# Patient Record
Sex: Male | Born: 1970 | Race: Black or African American | Hispanic: No | Marital: Single | Smoking: Current every day smoker
Health system: Southern US, Community
[De-identification: ages and names within clinical notes are randomized; demographics above are authoritative.]

## PROBLEM LIST (undated history)

## (undated) HISTORY — PX: TOTAL HIP ARTHROPLASTY: SHX124

---

## 2013-05-16 ENCOUNTER — Emergency Department (HOSPITAL_BASED_OUTPATIENT_CLINIC_OR_DEPARTMENT_OTHER): Payer: Self-pay

## 2013-05-16 ENCOUNTER — Encounter (HOSPITAL_BASED_OUTPATIENT_CLINIC_OR_DEPARTMENT_OTHER): Payer: Self-pay | Admitting: Emergency Medicine

## 2013-05-16 ENCOUNTER — Emergency Department (HOSPITAL_BASED_OUTPATIENT_CLINIC_OR_DEPARTMENT_OTHER)
Admission: EM | Admit: 2013-05-16 | Discharge: 2013-05-17 | Disposition: A | Discharge status: Home / Self Care | Payer: Self-pay | Attending: Emergency Medicine | Admitting: Emergency Medicine

## 2013-05-16 DIAGNOSIS — F17200 Nicotine dependence, unspecified, uncomplicated: Secondary | ICD-10-CM | POA: Insufficient documentation

## 2013-05-16 DIAGNOSIS — M7062 Trochanteric bursitis, left hip: Secondary | ICD-10-CM

## 2013-05-16 DIAGNOSIS — M76899 Other specified enthesopathies of unspecified lower limb, excluding foot: Secondary | ICD-10-CM | POA: Insufficient documentation

## 2013-05-16 DIAGNOSIS — Z96649 Presence of unspecified artificial hip joint: Secondary | ICD-10-CM | POA: Insufficient documentation

## 2013-05-16 NOTE — ED Notes (Signed)
Pt has had left hip replacement- c/o pain x 2 weeks without known injury- ambulates with limp

## 2013-05-17 MED ORDER — HYDROCODONE-ACETAMINOPHEN 7.5-325 MG/15ML PO SOLN
10.0000 mL | Freq: Once | ORAL | Status: AC
Start: 2013-05-17 — End: 2013-05-17
  Administered 2013-05-17: 10 mL via ORAL
  Filled 2013-05-17: qty 15

## 2013-05-17 MED ORDER — NAPROXEN SODIUM 550 MG PO TABS
ORAL_TABLET | ORAL | Status: DC
Start: 2013-05-17 — End: 2016-10-01

## 2013-05-17 MED ORDER — NAPROXEN 250 MG PO TABS
500.0000 mg | ORAL_TABLET | Freq: Once | ORAL | Status: AC
  Administered 2013-05-17: 500 mg via ORAL
  Filled 2013-05-17: qty 2

## 2013-05-17 MED ORDER — HYDROCODONE-ACETAMINOPHEN 5-325 MG PO TABS: 1.0000 | ORAL_TABLET | Freq: Four times a day (QID) | ORAL | Status: DC | PRN

## 2013-05-17 NOTE — ED Provider Notes (Addendum)
CSN: [id number]     Arrival date & time 05/16/13  2205 History   First MD Initiated Contact with Patient 05/16/13 2357    This chart was scribed for Hanley Seamen, MD by Marica Otter, ED Scribe. This patient was seen in room MH09/MH09 and the patient's care was started at 12:04 AM  PCP: No primary provider on file.  Chief Complaint  Patient presents with  . Hip Pain   HPI HPI Comments: Juan Moses is a 43 y.o. male, with a history of total hip arthroplasty, who presents to the Emergency Department complaining of left hip pain, onset couple of weeks ago, worsening tonight. Pt rates the pain a 8 out of 10. Pain is worse with movement or ambulation. He denies trauma. His surgery was performed in [place].  No past medical history on file. Past Surgical History  Procedure Laterality Date  . Total hip arthroplasty Left    No family history on file. History  Substance Use Topics  . Smoking status: Current Every Day Smoker    Types: Cigarettes  . Smokeless tobacco: Never Used  . Alcohol Use: 12.6 oz/week    21 Cans of beer per week    Review of Systems  Musculoskeletal:       Left hip pain  All other systems reviewed and are negative.    A complete 10 system review of systems was obtained and all systems are negative except as noted in the HPI and PMH.    Allergies  Morphine and related  Home Medications  No current outpatient prescriptions on file. BP 118/81  Pulse 88  Temp(Src) 98.4 F (36.9 C) (Oral)  Resp 20  Ht 6\' 3"  (1.905 m)  Wt 154 lb (69.854 kg)  BMI 19.25 kg/m2  SpO2 99%  Physical Exam General: Well-developed, well-nourished male in no acute distress; appearance consistent with age of record HENT: normocephalic; atraumatic Eyes: pupils equal, round and reactive to light; extraocular muscles intact Neck: supple Heart: regular rate and rhythm; no murmurs, rubs or gallops Lungs: clear to auscultation bilaterally Abdomen: soft; nondistended; nontender; no  masses or hepatosplenomegaly; bowel sounds present Extremities: No deformity; full range of motion; pulses normal. No deformities, pulses normal, normal ROM except for the hips limited by prostheses. Tenderness over left greater trochanter.  Neurologic: Awake, alert and oriented; motor function intact in all extremities and symmetric; no facial droop Skin: Warm and dry Psychiatric: Normal mood and affect  ED Course  Procedures (including critical care time) DIAGNOSTIC STUDIES: Oxygen Saturation is 99% on RA, normal by my interpretation.    COORDINATION OF CARE:  12:09 AM-Discussed treatment plan which includes imaging results and administering meds for the pain with pt at bedside and pt agreed to plan.    MDM  Nursing notes and vitals signs, including pulse oximetry, reviewed.  Summary of this visit's results, reviewed by myself:  Labs:  No results found for this or any previous visit (from the past 24 hour(s)).  Imaging Studies: Dg Hip Complete Left  05/16/2013   CLINICAL DATA:  Left hip pain.  No known injury.  EXAM: LEFT HIP - COMPLETE 2+ VIEW  COMPARISON:  None.  FINDINGS: Total bilateral hip arthroplasty. No acute abnormality such is fracture or dislocation. The ball is superiorly and laterally eccentric bilaterally, suggesting polyethylene wear.  There is scalloped lucency around both the femoral osteotomies and the acetabular components. This is particularly notable into the right ischium. These changes appear progressive from 2011 abdominal radiograph. 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  IMPRESSION: 1.  No acute osseous findings. 2. Bilateral total hip arthroplasty. Scalloped lucencies around the bilateral acetabular and femoral components could represent particle disease. Recommend outpatient orthopedic referral for correlation with previous imaging. 3. Bilateral asymmetric polyethylene wear.   Electronically Signed   By: Tiburcio PeaJonathan   Watts M.D.   On: 05/16/2013 23:31      Final diagnoses:  None    I personally performed the services described in this documentation, which was scribed in my presence. The recorded information has been reviewed and is accurate.    [name], MD 05/17/13 0013  [name], MD 05/17/13 [id number]

## 2013-05-17 NOTE — Discharge Instructions (Signed)
Hip Bursitis  Bursitis is a swelling and soreness (inflammation) of a fluid-filled sac (bursa). This sac overlies and protects the joints.   CAUSES   · Injury.  · Overuse of the muscles surrounding the joint.  · Arthritis.  · Gout.  · Infection.  · Cold weather.  · Inadequate warm-up and conditioning prior to activities.  The cause may not be known.   SYMPTOMS   · Mild to severe irritation.  · Tenderness and swelling over the outside of the hip.  · Pain with motion of the hip.  · If the bursa becomes infected, a fever may be present. Redness, tenderness, and warmth will develop over the hip.  Symptoms usually lessen in 3 to 4 weeks with treatment, but can come back.  TREATMENT  If conservative treatment does not work, your caregiver may advise draining the bursa and injecting cortisone into the area. This may speed up the healing process. This may also be used as an initial treatment of choice.  HOME CARE INSTRUCTIONS   · Apply ice to the affected area for 15-20 minutes every 3 to 4 hours while awake for the first 2 days. Put the ice in a plastic bag and place a towel between the bag of ice and your skin.  · Rest the painful joint as much as possible, but continue to put the joint through a normal range of motion at least 4 times per day. When the pain lessens, begin normal, slow movements and usual activities to help prevent stiffness of the hip.  · Only take over-the-counter or prescription medicines for pain, discomfort, or fever as directed by your caregiver.  · Use crutches to limit weight bearing on the hip joint, if advised.  · Elevate your painful hip to reduce swelling. Use pillows for propping and cushioning your legs and hips.  · Gentle massage may provide comfort and decrease swelling.  SEEK IMMEDIATE MEDICAL CARE IF:   · Your pain increases even during treatment, or you are not improving.  · You have a fever.  · You have heat and inflammation over the involved bursa.  · You have any other questions or  concerns.  MAKE SURE YOU:   · Understand these instructions.  · Will watch your condition.  · Will get help right away if you are not doing well or get worse.  Document Released: 07/28/2001 Document Revised: 04/30/2011 Document Reviewed: 02/25/2008  [employer]® Patient Information ©2014 [employer].

## 2013-12-06 ENCOUNTER — Encounter (HOSPITAL_BASED_OUTPATIENT_CLINIC_OR_DEPARTMENT_OTHER): Payer: Self-pay | Admitting: Emergency Medicine

## 2013-12-06 ENCOUNTER — Emergency Department (HOSPITAL_BASED_OUTPATIENT_CLINIC_OR_DEPARTMENT_OTHER): Payer: Self-pay

## 2013-12-06 ENCOUNTER — Emergency Department (HOSPITAL_BASED_OUTPATIENT_CLINIC_OR_DEPARTMENT_OTHER)
Admission: EM | Admit: 2013-12-06 | Discharge: 2013-12-06 | Disposition: A | Discharge status: Home / Self Care | Payer: Self-pay | Attending: Emergency Medicine | Admitting: Emergency Medicine

## 2013-12-06 DIAGNOSIS — Z720 Tobacco use: Secondary | ICD-10-CM | POA: Insufficient documentation

## 2013-12-06 DIAGNOSIS — Z791 Long term (current) use of non-steroidal anti-inflammatories (NSAID): Secondary | ICD-10-CM | POA: Insufficient documentation

## 2013-12-06 DIAGNOSIS — S022XXA Fracture of nasal bones, initial encounter for closed fracture: Secondary | ICD-10-CM | POA: Insufficient documentation

## 2013-12-06 DIAGNOSIS — S0083XA Contusion of other part of head, initial encounter: Secondary | ICD-10-CM | POA: Insufficient documentation

## 2013-12-06 MED ORDER — HYDROCODONE-ACETAMINOPHEN 5-325 MG PO TABS: 1.0000 | ORAL_TABLET | ORAL | Status: DC | PRN

## 2013-12-06 MED ORDER — HYDROCODONE-ACETAMINOPHEN 5-325 MG PO TABS
2.0000 | ORAL_TABLET | Freq: Once | ORAL | Status: AC
  Administered 2013-12-06: 2 via ORAL
  Filled 2013-12-06: qty 2
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
## 2013-12-06 NOTE — ED Provider Notes (Signed)
Medical screening examination/treatment/procedure(s) were performed by non-physician practitioner and as supervising physician I was immediately available for consultation/collaboration.   EKG Interpretation None       Arby BarretteMarcy Theone Bowell, MD 12/06/13 2359
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
## 2014-08-18 ENCOUNTER — Encounter (HOSPITAL_BASED_OUTPATIENT_CLINIC_OR_DEPARTMENT_OTHER): Payer: Self-pay | Admitting: Emergency Medicine

## 2014-08-18 ENCOUNTER — Emergency Department (HOSPITAL_BASED_OUTPATIENT_CLINIC_OR_DEPARTMENT_OTHER)
Admission: EM | Admit: 2014-08-18 | Discharge: 2014-08-18 | Disposition: A | Discharge status: Home / Self Care | Payer: Self-pay | Attending: Emergency Medicine | Admitting: Emergency Medicine

## 2014-08-18 ENCOUNTER — Emergency Department (HOSPITAL_BASED_OUTPATIENT_CLINIC_OR_DEPARTMENT_OTHER): Payer: Self-pay

## 2014-08-18 DIAGNOSIS — F1010 Alcohol abuse, uncomplicated: Secondary | ICD-10-CM | POA: Insufficient documentation

## 2014-08-18 DIAGNOSIS — R110 Nausea: Secondary | ICD-10-CM | POA: Insufficient documentation

## 2014-08-18 DIAGNOSIS — R1013 Epigastric pain: Secondary | ICD-10-CM | POA: Insufficient documentation

## 2014-08-18 DIAGNOSIS — Z720 Tobacco use: Secondary | ICD-10-CM | POA: Insufficient documentation

## 2014-08-18 LAB — COMPREHENSIVE METABOLIC PANEL
ALT: 20 U/L (ref 17–63)
ANION GAP: 14 (ref 5–15)
AST: 50 U/L — AB (ref 15–41)
Albumin: 3.9 g/dL (ref 3.5–5.0)
Alkaline Phosphatase: 92 U/L (ref 38–126)
BUN: 10 mg/dL (ref 6–20)
CHLORIDE: 96 mmol/L — AB (ref 101–111)
CO2: 22 mmol/L (ref 22–32)
Calcium: 9 mg/dL (ref 8.9–10.3)
Creatinine, Ser: 0.85 mg/dL (ref 0.61–1.24)
GFR calc Af Amer: >60 mL/min (ref >60)
GLUCOSE: 125 mg/dL — AB (ref 65–99)
Potassium: 4 mmol/L (ref 3.5–5.1)
Sodium: 132 mmol/L — ABNORMAL LOW (ref 135–145)
Total Bilirubin: 0.4 mg/dL (ref 0.3–1.2)
Total Protein: 7.5 g/dL (ref 6.5–8.1)

## 2014-08-18 LAB — CBC WITH DIFFERENTIAL/PLATELET
BASOS ABS: 0.1 10*3/uL (ref 0.0–0.1)
Basophils Relative: 2 % — ABNORMAL HIGH (ref 0–1)
EOS ABS: 0.1 10*3/uL (ref 0.0–0.7)
Eosinophils Relative: 1 % (ref 0–5)
HCT: 37.4 % — ABNORMAL LOW (ref 39.0–52.0)
Hemoglobin: 13.3 g/dL (ref 13.0–17.0)
LYMPHS PCT: 31 % (ref 12–46)
Lymphs Abs: 2.1 10*3/uL (ref 0.7–4.0)
MCH: 31.9 pg (ref 26.0–34.0)
MCHC: 35.6 g/dL (ref 30.0–36.0)
MCV: 89.7 fL (ref 78.0–100.0)
Monocytes Absolute: 0.7 10*3/uL (ref 0.1–1.0)
Monocytes Relative: 11 % (ref 3–12)
NEUTROS PCT: 55 % (ref 43–77)
Neutro Abs: 3.7 10*3/uL (ref 1.7–7.7)
Platelets: 292 10*3/uL (ref 150–400)
RBC: 4.17 MIL/uL — ABNORMAL LOW (ref 4.22–5.81)
RDW: 12.4 % (ref 11.5–15.5)
WBC: 6.7 10*3/uL (ref 4.0–10.5)

## 2014-08-18 LAB — URINALYSIS, ROUTINE W REFLEX MICROSCOPIC
Bilirubin Urine: NEGATIVE
GLUCOSE, UA: NEGATIVE mg/dL
Hgb urine dipstick: NEGATIVE
KETONES UR: NEGATIVE mg/dL
Leukocytes, UA: NEGATIVE
NITRITE: NEGATIVE
PROTEIN: NEGATIVE mg/dL
Specific Gravity, Urine: 1.004 — ABNORMAL LOW (ref 1.005–1.030)
UROBILINOGEN UA: 0.2 mg/dL (ref 0.0–1.0)
pH: 5.5 (ref 5.0–8.0)

## 2014-08-18 LAB — LIPASE, BLOOD: LIPASE: 15 U/L — AB (ref 22–51)

## 2014-08-18 LAB — ETHANOL: ALCOHOL ETHYL (B): 310 mg/dL — AB (ref <5)

## 2014-08-18 MED ORDER — IOHEXOL 300 MG/ML  SOLN
100.0000 mL | Freq: Once | INTRAMUSCULAR | Status: AC | PRN
  Administered 2014-08-18: 100 mL via INTRAVENOUS

## 2014-08-18 MED ORDER — GI COCKTAIL ~~LOC~~
30.0000 mL | Freq: Once | ORAL | Status: AC
  Administered 2014-08-18: 30 mL via ORAL
  Filled 2014-08-18: qty 30

## 2014-08-18 MED ORDER — FENTANYL CITRATE (PF) 100 MCG/2ML IJ SOLN
100.0000 ug | Freq: Once | INTRAMUSCULAR | Status: AC
  Administered 2014-08-18: 100 ug via INTRAVENOUS
  Filled 2014-08-18: qty 2

## 2014-08-18 MED ORDER — ONDANSETRON HCL 4 MG/2ML IJ SOLN
4.0000 mg | Freq: Once | INTRAMUSCULAR | Status: AC
  Administered 2014-08-18: 4 mg via INTRAVENOUS
  Filled 2014-08-18: qty 2

## 2014-08-18 NOTE — ED Notes (Signed)
Generalized abd pain x2 days.

## 2014-08-18 NOTE — ED Provider Notes (Signed)
CSN: [id number]     Arrival date & time 08/18/14  2040 History   First MD Initiated Contact with Patient 08/18/14 2048     Chief Complaint  Patient presents with  . Abdominal Pain     (Consider location/radiation/quality/duration/timing/severity/associated sxs/prior Treatment) Patient is a 44 y.o. male presenting with abdominal pain. The history is provided by the patient. No language interpreter was used.  Abdominal Pain Pain location:  RUQ Pain quality: aching   Pain radiates to:  Does not radiate Pain severity:  Moderate Onset quality:  Sudden Duration:  2 days Timing:  Constant Progression:  Unchanged Chronicity:  New Context: alcohol use   Relieved by:  Nothing Worsened by:  Nothing tried Ineffective treatments:  None tried Associated symptoms: nausea   Associated symptoms: no fever   Risk factors: alcohol abuse     History reviewed. No pertinent past medical history. Past Surgical History  Procedure Laterality Date  . Total hip arthroplasty Left    No family history on file. History  Substance Use Topics  . Smoking status: Current Every Day Smoker -- 1.00 packs/day    Types: Cigarettes  . Smokeless tobacco: Never Used  . Alcohol Use: 12.6 oz/week    21 Cans of beer per week    Review of Systems  Constitutional: Negative for fever.  Gastrointestinal: Positive for nausea and abdominal pain.  All other systems reviewed and are negative.     Allergies  Morphine and related  Home Medications   Prior to Admission medications   Medication Sig Start Date End Date Taking? Authorizing Provider  HYDROcodone-acetaminophen (NORCO/VICODIN) 5-325 MG per tablet Take 1-2 tablets by mouth every 6 (six) hours as needed for moderate pain. 05/17/13   John Molpus, MD  HYDROcodone-acetaminophen (NORCO/VICODIN) 5-325 MG per tablet Take 1-2 tablets by mouth every 4 (four) hours as needed. 12/06/13   Kathrynn Speedobyn M Hess, PA-C  naproxen sodium (ANAPROX DS) 550 MG tablet Take 1 tablet  every 12 hours for hip pain. Best taken with a meal. 05/17/13   John Molpus, MD   BP 115/73 mmHg  Pulse 108  Temp(Src) 98.4 F (36.9 C) (Oral)  Resp 18  Ht 6\' 3"  (1.905 m)  Wt 160 lb (72.576 kg)  BMI 20.00 kg/m2  SpO2 98% Physical Exam  Constitutional: He is oriented to person, place, and time. He appears well-developed and well-nourished.  HENT:  Head: Normocephalic and atraumatic.  Cardiovascular: Normal rate and regular rhythm.   Pulmonary/Chest: Effort normal and breath sounds normal.  Abdominal: Soft. Bowel sounds are normal. There is tenderness in the right upper quadrant and epigastric area.  Musculoskeletal: Normal range of motion.  Neurological: He is oriented to person, place, and time.  Skin: Skin is warm and dry.  Psychiatric: He has a normal mood and affect.  Nursing note and vitals reviewed.   ED Course  Procedures (including critical care time) Labs Review Labs Reviewed  CBC WITH DIFFERENTIAL/PLATELET - Abnormal; Notable for the following:    RBC 4.17 (*)    HCT 37.4 (*)    Basophils Relative 2 (*)    All other components within normal limits  COMPREHENSIVE METABOLIC PANEL - Abnormal; Notable for the following:    Sodium 132 (*)    Chloride 96 (*)    Glucose, Bld 125 (*)    AST 50 (*)    All other components within normal limits  LIPASE, BLOOD - Abnormal; Notable for the following:    Lipase 15 (*)  All other components within normal limits  ETHANOL - Abnormal; Notable for the following:    Alcohol, Ethyl (B) 310 (*)    All other components within normal limits  URINALYSIS, ROUTINE W REFLEX MICROSCOPIC (NOT AT [facility]) - Abnormal; Notable for the following:    Specific Gravity, Urine 1.004 (*)    All other components within normal limits    Imaging Review No results found.   EKG Interpretation None      MDM   Final diagnoses:  ETOH abuse    Pt waiting for ct scan. Will leave with DR. Loretha Stapler    Teressa Lower, NP 08/18/14  1610  Blake Divine, MD 08/18/14 [phone]

## 2014-08-18 NOTE — ED Notes (Signed)
NP at bedside.

## 2014-08-18 NOTE — ED Notes (Signed)
Patient transported to CT 

## 2014-08-18 NOTE — Discharge Instructions (Signed)
Abdominal Pain °Many things can cause abdominal pain. Usually, abdominal pain is not caused by a disease and will improve without treatment. It can often be observed and treated at home. Your health care provider will do a physical exam and possibly order blood tests and X-rays to help determine the seriousness of your pain. However, in many cases, more time must pass before a clear cause of the pain can be found. Before that point, your health care provider may not know if you need more testing or further treatment. °HOME CARE INSTRUCTIONS  °Monitor your abdominal pain for any changes. The following actions may help to alleviate any discomfort you are experiencing: °· Only take over-the-counter or prescription medicines as directed by your health care provider. °· Do not take laxatives unless directed to do so by your health care provider. °· Try a clear liquid diet (broth, tea, or water) as directed by your health care provider. Slowly move to a bland diet as tolerated. °SEEK MEDICAL CARE IF: °· You have unexplained abdominal pain. °· You have abdominal pain associated with nausea or diarrhea. °· You have pain when you urinate or have a bowel movement. °· You experience abdominal pain that wakes you in the night. °· You have abdominal pain that is worsened or improved by eating food. °· You have abdominal pain that is worsened with eating fatty foods. °· You have a fever. °SEEK IMMEDIATE MEDICAL CARE IF:  °· Your pain does not go away within 2 hours. °· You keep throwing up (vomiting). °· Your pain is felt only in portions of the abdomen, such as the right side or the left lower portion of the abdomen. °· You pass bloody or black tarry stools. °MAKE SURE YOU: °· Understand these instructions.   °· Will watch your condition.   °· Will get help right away if you are not doing well or get worse.   °Document Released: 11/15/2004 Document Revised: 02/10/2013 Document Reviewed: 10/15/2012 °ExitCare® Patient Information  ©2015 [employer]. This information is not intended to replace advice given to you by your health care provider. Make sure you discuss any questions you have with your health care provider. ° °Alcohol Use Disorder °Alcohol use disorder is a mental disorder. It is not a one-time incident of heavy drinking. Alcohol use disorder is the excessive and uncontrollable use of alcohol over time that leads to problems with functioning in one or more areas of daily living. People with this disorder risk harming themselves and others when they drink to excess. Alcohol use disorder also can cause other mental disorders, such as mood and anxiety disorders, and serious physical problems. People with alcohol use disorder often misuse other drugs.  °Alcohol use disorder is common and widespread. Some people with this disorder drink alcohol to cope with or escape from negative life events. Others drink to relieve chronic pain or symptoms of mental illness. People with a family history of alcohol use disorder are at higher risk of losing control and using alcohol to excess.  °SYMPTOMS  °Signs and symptoms of alcohol use disorder may include the following:  °· Consumption of alcohol in larger amounts or over a longer period of time than intended. °· Multiple unsuccessful attempts to cut down or control alcohol use.   °· A great deal of time spent obtaining alcohol, using alcohol, or recovering from the effects of alcohol (hangover). °· A strong desire or urge to use alcohol (cravings).   °· Continued use of alcohol despite problems at work, school, or home because of alcohol   use.   °· Continued use of alcohol despite problems in relationships because of alcohol use. °· Continued use of alcohol in situations when it is physically hazardous, such as driving a car. °· Continued use of alcohol despite awareness of a physical or psychological problem that is likely related to alcohol use. Physical problems related to alcohol use can  involve the brain, heart, liver, stomach, and intestines. Psychological problems related to alcohol use include intoxication, depression, anxiety, psychosis, delirium, and dementia.   °· The need for increased amounts of alcohol to achieve the same desired effect, or a decreased effect from the consumption of the same amount of alcohol (tolerance). °· Withdrawal symptoms upon reducing or stopping alcohol use, or alcohol use to reduce or avoid withdrawal symptoms. Withdrawal symptoms include: °¨ Racing heart. °¨ Hand tremor. °¨ Difficulty sleeping. °¨ Nausea. °¨ Vomiting. °¨ Hallucinations. °¨ Restlessness. °¨ Seizures. °DIAGNOSIS °Alcohol use disorder is diagnosed through an assessment by your health care provider. Your health care provider may start by asking three or four questions to screen for excessive or problematic alcohol use. To confirm a diagnosis of alcohol use disorder, at least two symptoms must be present within a 12-month period. The severity of alcohol use disorder depends on the number of symptoms: °· Mild--two or three. °· Moderate--four or five. °· Severe--six or more. °Your health care provider may perform a physical exam or use results from lab tests to see if you have physical problems resulting from alcohol use. Your health care provider may refer you to a mental health professional for evaluation. °TREATMENT  °Some people with alcohol use disorder are able to reduce their alcohol use to low-risk levels. Some people with alcohol use disorder need to quit drinking alcohol. When necessary, mental health professionals with specialized training in substance use treatment can help. Your health care provider can help you decide how severe your alcohol use disorder is and what type of treatment you need. The following forms of treatment are available:  °· Detoxification. Detoxification involves the use of prescription medicines to prevent alcohol withdrawal symptoms in the first week after quitting.  This is important for people with a history of symptoms of withdrawal and for heavy drinkers who are likely to have withdrawal symptoms. Alcohol withdrawal can be dangerous and, in severe cases, cause death. Detoxification is usually provided in a hospital or in-patient substance use treatment facility. °· Counseling or talk therapy. Talk therapy is provided by substance use treatment counselors. It addresses the reasons people use alcohol and ways to keep them from drinking again. The goals of talk therapy are to help people with alcohol use disorder find healthy activities and ways to cope with life stress, to identify and avoid triggers for alcohol use, and to handle cravings, which can cause relapse. °· Medicines. Different medicines can help treat alcohol use disorder through the following actions: °¨ Decrease alcohol cravings. °¨ Decrease the positive reward response felt from alcohol use. °¨ Produce an uncomfortable physical reaction when alcohol is used (aversion therapy). °· Support groups. Support groups are run by people who have quit drinking. They provide emotional support, advice, and guidance. °These forms of treatment are often combined. Some people with alcohol use disorder benefit from intensive combination treatment provided by specialized substance use treatment centers. Both inpatient and outpatient treatment programs are available. °Document Released: 03/15/2004 Document Revised: 06/22/2013 Document Reviewed: 05/15/2012 °ExitCare® Patient Information ©2015 [employer]. This information is not intended to replace advice given to you by your health care provider.   Make sure you discuss any questions you have with your health care provider. ° °Emergency Department Resource Guide °1) Find a Doctor and Pay Out of Pocket °Although you won't have to find out who is covered by your insurance plan, it is a good idea to ask around and get recommendations. You will then need to call the office and see if  the doctor you have chosen will accept you as a new patient and what types of options they offer for patients who are self-pay. Some doctors offer discounts or will set up payment plans for their patients who do not have insurance, but you will need to ask so you aren't surprised when you get to your appointment. ° °2) Contact Your Local Health Department °Not all health departments have doctors that can see patients for sick visits, but many do, so it is worth a call to see if yours does. If you don't know where your local health department is, you can check in your phone book. The CDC also has a tool to help you locate your state's health department, and many state websites also have listings of all of their local health departments. ° °3) Find a Walk-in Clinic °If your illness is not likely to be very severe or complicated, you may want to try a walk in clinic. These are popping up all over the country in pharmacies, drugstores, and shopping centers. They're usually staffed by nurse practitioners or physician assistants that have been trained to treat common illnesses and complaints. They're usually fairly quick and inexpensive. However, if you have serious medical issues or chronic medical problems, these are probably not your best option. ° °No Primary Care Doctor: °- Call [facility] at  832-8000 - they can help you locate a primary care doctor that  accepts your insurance, provides certain services, etc. °- Physician Referral Service- 1-800-533-3463 ° °Chronic Pain Problems: °Organization         Address  Phone   Notes  °[facility]  (336) 297-2271 Patients need to be referred by their primary care doctor.  ° °Medication Assistance: °Organization         Address  Phone   Notes  °[place] [facility] [address] °[place], [place] [address] (336) 641-8030 --Must be a resident of [place] °-- Must have NO insurance coverage whatsoever (no  Medicaid/ Medicare, etc.) °-- The pt. MUST have a primary care doctor that directs their care regularly and follows them in the community °  °[facility]  (866) 331-1348   °[employer]  (888) 892-1162   ° °Agencies that provide inexpensive medical care: °Organization         Address  Phone   Notes  °[facility]  (336) 832-8035   °[facility]    (336) 832-7272   °[facility] [address] °[place],  [address] (336) 832-4777   °[facility] [address], °[place] (336) 271-4999   °[facility]    (336) 373-0678   °[facility]    (336) 272-1050   °[facility] ° [address], [place] Phone:  (336) 832-4444, Fax:  (336) 832-4440 Hours of Operation:  9 am - 6 pm, M-F.  Also accepts Medicaid/Medicare and self-pay.  °[facility] ° [address], [place] Phone: (336) [phone], Fax: (336) [phone]. Hours of Operation:  8:30 am -   5:30 pm, M-F.  Also accepts Medicaid and self-pay.  °[facility] [place] [address], [place] Phone: (336) 878-6027   °[facility] [address], [place], [place] (336)723-1848, Ext. 123 Mondays & Thursdays: 7-9 AM.  First 15 patients are seen on a first come, first serve basis. °  ° °Medicaid-accepting [place] Providers: ° °Organization         Address  Phone   Notes  °[facility] [address], [place] (336) 641-2100 Also accepts self-pay patients.  °[facility] [address], [place] ° (336) 856-9996   °[facility] [address], [place] (336) 288-8857   °[facility] [address] [place] [address], [place] (336) 299-7000   °Veita Bland [address], [place]  ° (336) 373-1557 Only accepts [place] Access Medicaid patients after they have their name applied to their card.   ° °Self-Pay (no insurance) in [place]: ° °Organization         Address  Phone   Notes  °Sickle Cell Patients, [facility] [address], [place] (336) 832-1970   °[facility] [address], [place] (336) 832-4400   °[facility] ° [address],  (336) 992-4800   °[facility]/Dr. Osei-Bonsu ° [address] [place] [address], [place] or [address], [place] (336) [phone] Phone number for both [place] and [place] locations is the same.  °[facility] [address], [place] (336) 299-0000   °[facility] [address] [place] [address], [place] or [address] (336) 852-7530 °(336) 878-2260   °[facility] [address], [place] (336) 350-1642, phone; (336) 294-5005, fax Sees patients 1st and 3rd Saturday of every month.  Must not qualify for public or private insurance (i.e. Medicaid, Medicare, [place] Health Choice, Veterans' Benefits) • Household income should be no more than 200% of the poverty level •The clinic cannot treat you if you are pregnant or think you are pregnant • Sexually transmitted diseases are not treated at the clinic.  ° ° °Dental Care: °Organization         Address  Phone  Notes  °[facility] [address], [place] (336) 641-6152 Accepts children up to age 21 who are enrolled in Medicaid or [place] Health Choice; pregnant women with a Medicaid card; and children who have applied for Medicaid or [place] Health Choice, but were declined, whose parents can pay a reduced fee at time of service.  °[facility] [place]  [address], [place] (336) 641-7733 Accepts children up to age 21 who are enrolled in Medicaid or [place] Health Choice; pregnant women with a Medicaid card; and children who have applied for Medicaid or [place] Health Choice,  but were declined, whose parents can pay a reduced fee at time of service.  °[facility] ° [address],  (336) 641-4533 Patients are seen by appointment only. Walk-ins are not accepted. [facility] will see patients 18 years of age and older. °Monday - Tuesday (8am-5pm) °Most Wednesdays (8:30-5pm) °$30 per visit, cash only  °[facility] ° [address], [place] (336) 641-4533 Patients are seen by appointment only. Walk-ins are   not accepted. [facility] will see patients 18 years of age and older. °One Wednesday Evening (Monthly: Volunteer Based).  $30 per visit, cash only  °[facility]  (919) 537-3737 for adults; Children under age 4, call Graduate Pediatric Dentistry at ([phone]) [phone]. Children aged 4-14, please call ([phone]) [phone] to request a pediatric application. ° Dental services are provided in all areas of dental care including fillings, crowns and bridges, complete and partial dentures, implants, gum treatment, root canals, and extractions. Preventive care is also provided. Treatment is provided to both adults and children. °Patients are selected via a lottery and there is often a waiting list. °  °[facility] [address], °[place] ° (336) 763-8833 [email or web address] °  °[facility] [address], [place], [place] (336)723-1848, Ext. 123 Second and Fourth Thursday of each month, opens at 6:30 AM; Clinic ends at 9 AM.  Patients are seen on a first-come first-served basis, and a limited number are seen during each clinic.  ° °[facility] ° [address], [place], [place] (336) 723-7904   Eligibility Requirements °You must have lived in [place], [place], or [place] counties for at least the last three months. °  You cannot be eligible for state or federal sponsored healthcare insurance, including Veterans Administration, Medicaid, or Medicare. °  You generally  cannot be eligible for healthcare insurance through your employer.  °  How to apply: °Eligibility screenings are held every Tuesday and Wednesday afternoon from 1:00 pm until 4:00 pm. You do not need an appointment for the interview!  °[facility] [address], [place], [place] 336-631-2330   °[facility]  336-342-8273   °[facility]  336-703-3100   °[facility]  336-570-6415   ° °Behavioral Health Resources in the Community: °Intensive Outpatient Programs °Organization         Address  Phone  Notes  °[facility] [address], [place], [place] 336-878-6098   °[facility] [address], [place], [place] 336-832-9800   °[facility] [address], [place], [place] ° 336-882-2125   °[facility] [address],  °[place], [place] 1-800-853-5163 or 336-641-4981   °Substance Abuse Resources °Organization         Address  Phone  Notes  °[facility]  336-882-2125   °[facility]  336-784-9470   °[facility]  336-285-9073   °[facility]  336-845-3988   °Residential & Outpatient Substance Abuse Program  1-800-659-3381   °Psychological Services °Organization         Address  Phone  Notes  °[facility]  336- 832-9600   °[facility]  336- 378-7881   °[facility] [address], [place] 1-800-853-5163 or 336-641-4981   ° °Mobile Crisis Teams °Organization         Address  Phone  Notes  °[facility], Mobile Crisis Care Unit  1-877-626-1772   °Assertive °[facility] ° [address] [place], [place] 336-834-9664   °Sharon DeEsch [address] °[place] 336-554-5454   ° °Self-Help/Support Groups °Organization         Address  Phone             Notes  °[facility] - variety of support groups  336- 373-1402 Call for more  information  °Narcotics Anonymous (NA),   [facility] [address], °[place]  2 meetings at this location  ° °Residential Treatment Programs °Organization         Address  Phone  Notes  °[facility] [address],    °[place]  1-866-801-8205   °[facility] ° [address], [place], [place] 704-293-8524   °[facility] [address], [place] 336-845-3988 Admissions: 8am-3pm M-F  °[facility] [address]    °[place], [place] 336-841-1104   °[facility] [address], [place], [place] 336-379-7146   °[facility] [address]  °[place], [place] 336-285-9073   °[facility] - Intensive Outpatient [address], [place], [place] 336-852-3033   °[facility] [address]  °[place], [place] 1-877-615-2722 or 336-784-9470   °[facility] [address] [place], [place] 336-227-7417 Accepts Medicaid  °[facility] [address]  °[place] 1-800-659-3381 Substance Abuse/Addiction Treatment  ° °[place] Behavioral Health Resources °Organization         Address  Phone  Notes  °[facility]  (888) 581-9988   °Julie Brannon, PhD [address] [place], [place]   (336) 349-5553 or (336) 951-0000   °[facility]   [address] °[place], [place] (336) 349-4454   °[facility] [address], [place], [place] (336) 342-8316 Insurance/Medicaid/sponsorship through [facility]  °[facility] [address]                                    [place], [place] (336) 342-8316 Therapy/tele-psych/case  °[facility] [address]  ° [place], [place] (336) 349-2233    °Dr. Arfeen  (336) 349-4544   °[facility] [place]  [employer] [place] [facility] 1) [address], [place] °2) [address], [place] °3)  [address], [place] (336)  349-3220 °(336) 342-7768 ° °(336) 342-8140   °[place] [facility] (336) 342-1394 or (336) 342-3537 (After Hours)    ° °  °

## 2014-12-27 ENCOUNTER — Encounter (HOSPITAL_BASED_OUTPATIENT_CLINIC_OR_DEPARTMENT_OTHER): Payer: Self-pay | Admitting: Emergency Medicine

## 2014-12-27 ENCOUNTER — Emergency Department (HOSPITAL_BASED_OUTPATIENT_CLINIC_OR_DEPARTMENT_OTHER)
Admission: EM | Admit: 2014-12-27 | Discharge: 2014-12-27 | Disposition: A | Discharge status: Home / Self Care | Payer: Self-pay | Attending: Emergency Medicine | Admitting: Emergency Medicine

## 2014-12-27 DIAGNOSIS — S46911A Strain of unspecified muscle, fascia and tendon at shoulder and upper arm level, right arm, initial encounter: Secondary | ICD-10-CM | POA: Insufficient documentation

## 2014-12-27 DIAGNOSIS — X58XXXA Exposure to other specified factors, initial encounter: Secondary | ICD-10-CM | POA: Insufficient documentation

## 2014-12-27 DIAGNOSIS — Y998 Other external cause status: Secondary | ICD-10-CM | POA: Insufficient documentation

## 2014-12-27 DIAGNOSIS — Z720 Tobacco use: Secondary | ICD-10-CM | POA: Insufficient documentation

## 2014-12-27 DIAGNOSIS — Y9389 Activity, other specified: Secondary | ICD-10-CM | POA: Insufficient documentation

## 2014-12-27 DIAGNOSIS — Y9289 Other specified places as the place of occurrence of the external cause: Secondary | ICD-10-CM | POA: Insufficient documentation

## 2014-12-27 MED ORDER — NAPROXEN 250 MG PO TABS
500.0000 mg | ORAL_TABLET | Freq: Once | ORAL | Status: AC
  Administered 2014-12-27: 500 mg via ORAL
  Filled 2014-12-27: qty 2

## 2014-12-27 MED ORDER — MELOXICAM 15 MG PO TABS: 15.0000 mg | ORAL_TABLET | Freq: Every day | ORAL | Status: DC

## 2014-12-27 NOTE — ED Provider Notes (Signed)
CSN: [id number]     Arrival date & time 12/27/14  0020 History  By signing my name below, I, Evon Slack, attest that this documentation has been prepared under the direction and in the presence of Graceson Nichelson, MD. Electronically Signed: Evon Slack, ED Scribe. 12/27/2014. 12:52 AM.     Chief Complaint  Patient presents with  . Shoulder Pain   Patient is a 44 y.o. male presenting with shoulder pain. The history is provided by the patient. No language interpreter was used.  Shoulder Pain Location:  Shoulder Time since incident:  3 days Injury: no   Shoulder location:  R shoulder Pain details:    Quality:  Aching   Radiates to:  Does not radiate   Severity:  Mild   Onset quality:  Gradual   Duration:  3 days   Timing:  Constant   Progression:  Unchanged Chronicity:  New Handedness:  Right-handed Dislocation: no   Prior injury to area:  No Relieved by:  Nothing Worsened by:  Movement Ineffective treatments: drinking alcohol. Associated symptoms: no back pain, no decreased range of motion, no fever, no muscle weakness, no neck pain, no numbness, no stiffness, no swelling and no tingling   Risk factors: no frequent fractures    HPI Comments: Juan Moses is a 44 y.o. male who presents to the Emergency Department complaining of new sharp right shoulder pain onset 2-3 days prior. Pt states that certain movements makes the pain worse. Pt states that he has tried drinking alcohol with no relief. Pt denies numbness or tingling. Pt denies injury or trauma to the shoulder. Pt denies any recent falls.     History reviewed. No pertinent past medical history. Past Surgical History  Procedure Laterality Date  . Total hip arthroplasty Left    History reviewed. No pertinent family history. Social History  Substance Use Topics  . Smoking status: Current Every Day Smoker -- 1.00 packs/day    Types: Cigarettes  . Smokeless tobacco: Never Used  . Alcohol Use: 12.6 oz/week    21  Cans of beer per week    Review of Systems  Constitutional: Negative for fever.  Respiratory: Negative for choking, chest tightness and shortness of breath.   Cardiovascular: Negative for chest pain, palpitations and leg swelling.  Musculoskeletal: Positive for arthralgias. Negative for back pain, stiffness and neck pain.  Neurological: Negative for weakness and numbness.  All other systems reviewed and are negative.    Allergies  Morphine and related  Home Medications   Prior to Admission medications   Medication Sig Start Date End Date Taking? Authorizing Provider  HYDROcodone-acetaminophen (NORCO/VICODIN) 5-325 MG per tablet Take 1-2 tablets by mouth every 6 (six) hours as needed for moderate pain. 05/17/13   John Molpus, MD  HYDROcodone-acetaminophen (NORCO/VICODIN) 5-325 MG per tablet Take 1-2 tablets by mouth every 4 (four) hours as needed. 12/06/13   Kathrynn Speed, PA-C  naproxen sodium (ANAPROX DS) 550 MG tablet Take 1 tablet every 12 hours for hip pain. Best taken with a meal. 05/17/13   John Molpus, MD   BP 127/85 mmHg  Pulse 114  Temp(Src) 98 F (36.7 C) (Oral)  Resp 18  Ht  (1.905 m)  Wt 163 lb (73.936 kg)  BMI 20.37 kg/m2  SpO2 99%   Physical Exam  Constitutional: He is oriented to person, place, and time. He appears well-developed and well-nourished. No distress.  Smells of ETOH  HENT:  Head: Normocephalic and atraumatic.  Mouth/Throat: Oropharynx is  clear and moist.  Eyes: Conjunctivae and EOM are normal. Pupils are equal, round, and reactive to light.  Neck: Normal range of motion. Neck supple. No tracheal deviation present.  Cardiovascular: Normal rate and intact distal pulses.   Pulses:      Radial pulses are 2+ on the right side, and 2+ on the left side.  Pulmonary/Chest: Effort normal. No respiratory distress. He has no wheezes. He has no rales.  Abdominal: Soft. He exhibits no mass. Bowel sounds are increased. There is no tenderness. There is no  rebound and no guarding.  Musculoskeletal: Normal range of motion. He exhibits no edema or tenderness.       Right shoulder: He exhibits normal range of motion, no tenderness, no bony tenderness, no swelling, no effusion, no crepitus, no deformity, no laceration, no pain, no spasm, normal pulse and normal strength.       Right elbow: Normal.      Right wrist: Normal.       Right upper arm: Normal.       Right forearm: Normal.  No snuffbox tenderness, cap refill less than 2 sec, Right hand NVI, Intact supination and pronation, Biceps and triceps tendon intact, 5/5 strength in RUE. clavicle intact. No step off or crepitus of spine No winging of scapula  Neurological: He is alert and oriented to person, place, and time. He has normal reflexes. He displays no atrophy. He exhibits normal muscle tone. GCS eye subscore is 4. GCS verbal subscore is 5. GCS motor subscore is 6.  Skin: Skin is warm and dry.  Psychiatric: He has a normal mood and affect. His behavior is normal.  Nursing note and vitals reviewed.   ED Course  Procedures (including critical care time) DIAGNOSTIC STUDIES: Oxygen Saturation is 99% on RA, normal by my interpretation.    COORDINATION OF CARE: 12:56 AM-Discussed treatment plan with pt at bedside and pt agreed to plan.     Labs Review Labs Reviewed - No data to display  Imaging Review No results found.    EKG Interpretation None      MDM   Final diagnoses:  None   Medications  naproxen (NAPROSYN) tablet 500 mg (500 mg Oral Given 12/27/14 0100)    Symptoms consistent with shoulder strain.  ROM is normal.  NEERs test is negative 5/5 strength.  There are no non verbal signs of pain.  Will treat with ice and nsaids no etoh.    I personally performed the services described in this documentation, which was scribed in my presence. The recorded information has been reviewed and is accurate.        Cy BlamerApril Kelin Borum, MD 12/27/14 [phone]

## 2014-12-27 NOTE — ED Notes (Addendum)
Patient states that he has had right shoulder pain x 2 -3 days. Reports no change today other than it still hurts. Patient performing ROM on own in the triage area to show this RN how much it hurts to move.

## 2014-12-27 NOTE — Discharge Instructions (Signed)

## 2016-04-26 IMAGING — CT CT ABD-PELV W/ CM
1 of 3 series · 14 of 32 positions shown, 19 images · IV contrast (APPLIED)
Comparison: 08/11/2014

CLINICAL DATA: Right upper quadrant pain for 2 days, nausea and
vomiting

EXAM:
CT ABDOMEN AND PELVIS WITH CONTRAST
TECHNIQUE: Multidetector CT imaging of the abdomen and pelvis was performed
using the standard protocol following bolus administration of
intravenous contrast.
CONTRAST:  100mL OMNIPAQUE IOHEXOL 300 MG/ML  SOLN

[Series 2: abd/pelvis 5.0 b31f · axial · 0.63mm/px · z∈[+923,+1343]mm · 14 of 96 slices shown, 19 images]
[im 6/96  soft-tissue]
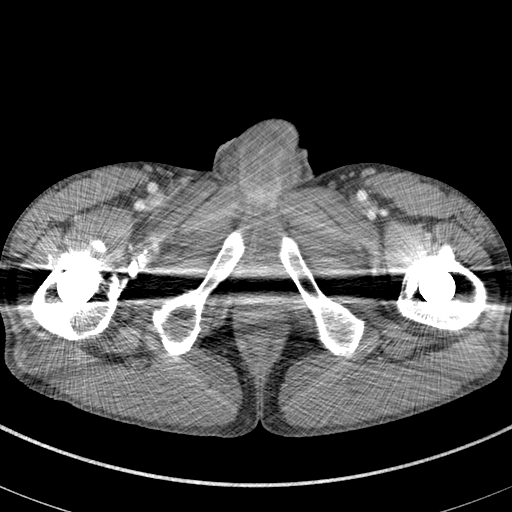
[im 6/96  bone]
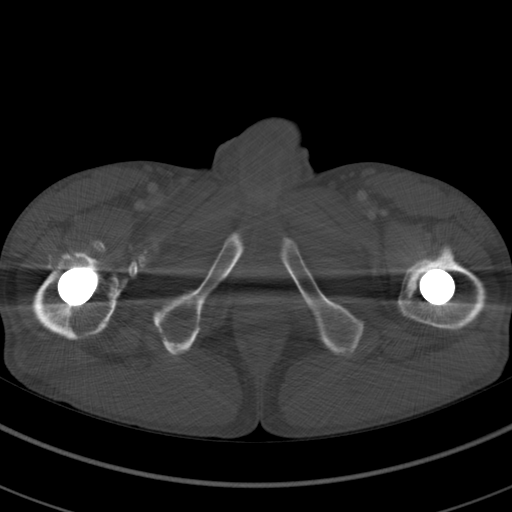
[im 11/96  soft-tissue]
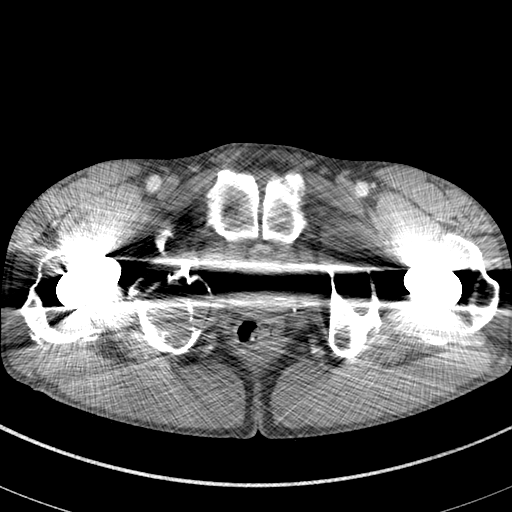
[im 27/96  soft-tissue]
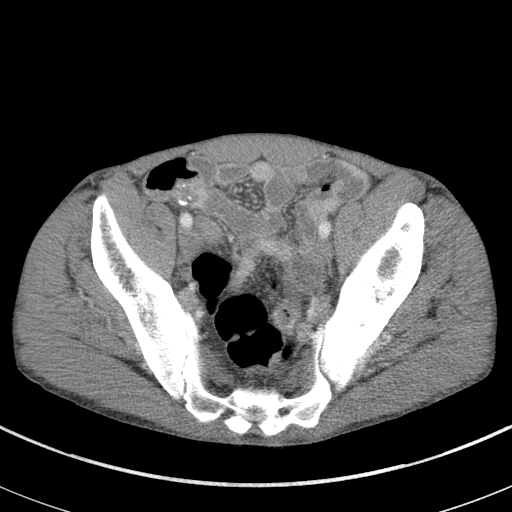
[im 32/96  soft-tissue]
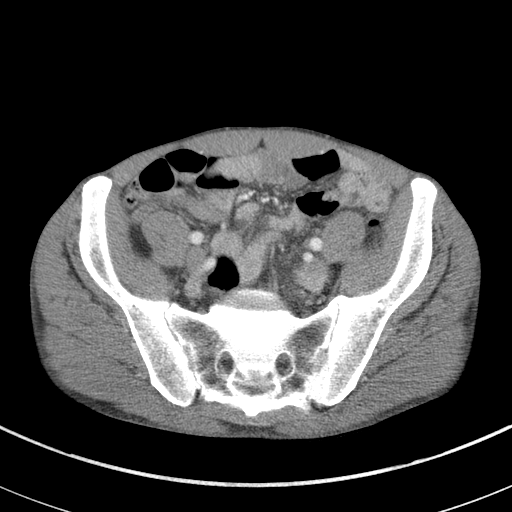
[im 37/96  soft-tissue]
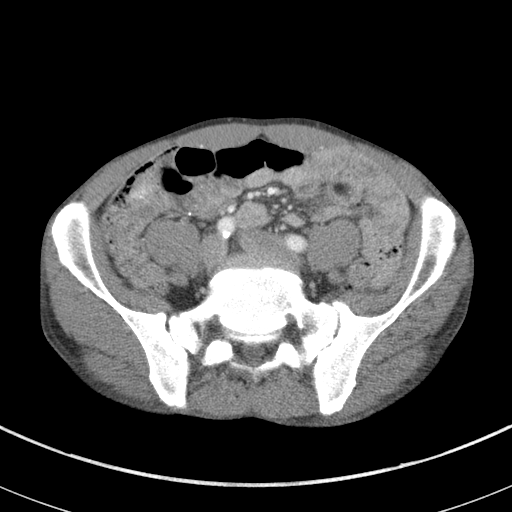
[im 43/96  soft-tissue]
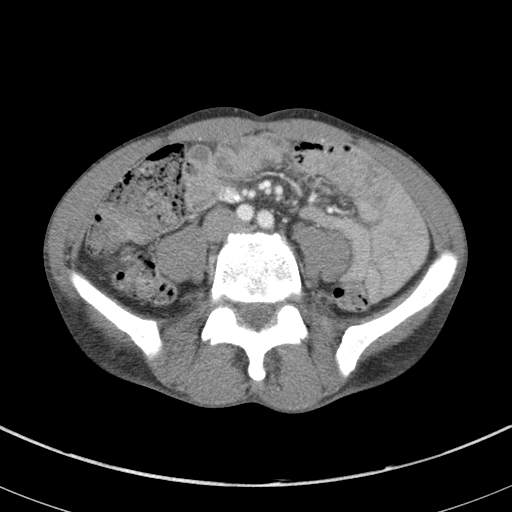
[im 53/96  soft-tissue]
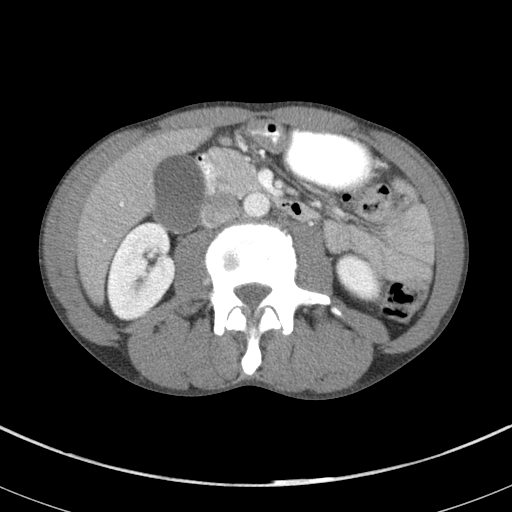
[im 59/96  soft-tissue]
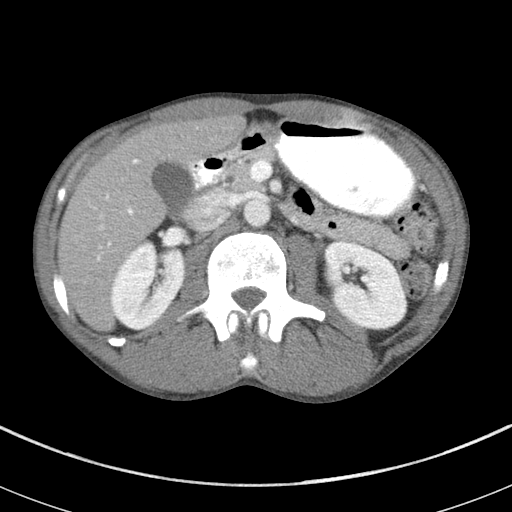
[im 64/96  soft-tissue]
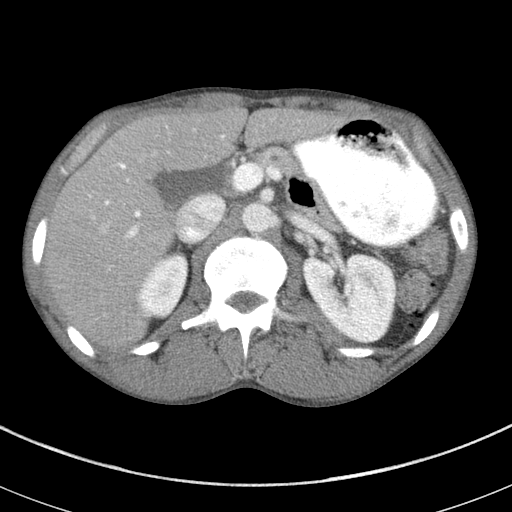
[im 64/96  bone]
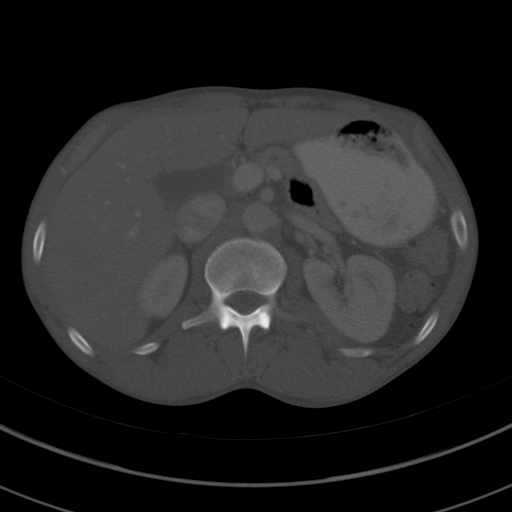
[im 69/96  soft-tissue]
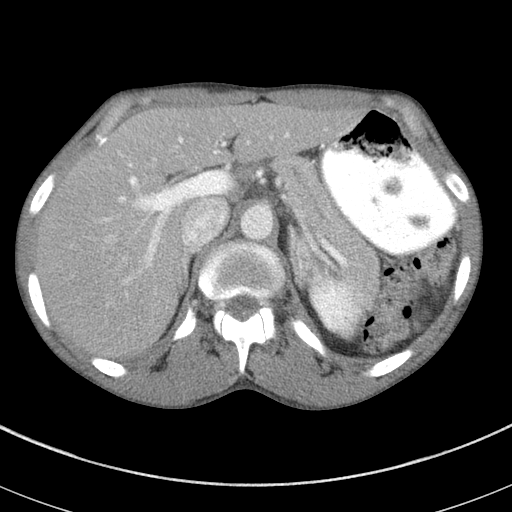
[im 74/96  soft-tissue]
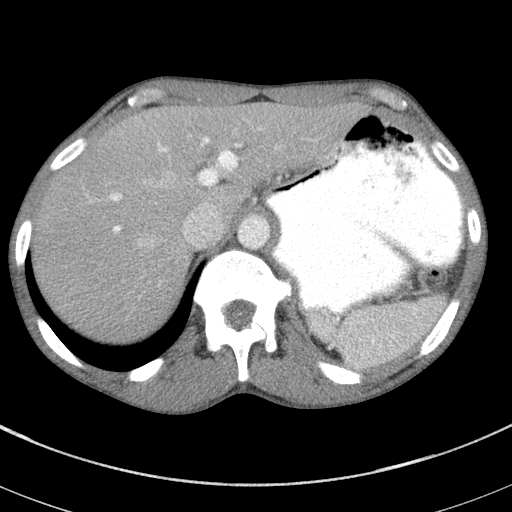
[im 74/96  lung]
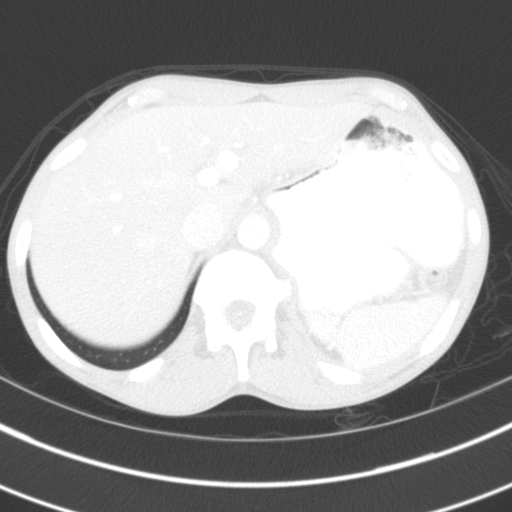
[im 80/96  lung]
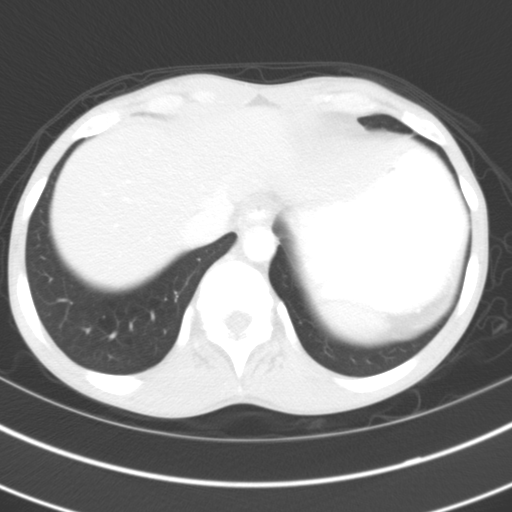
[im 85/96  soft-tissue]
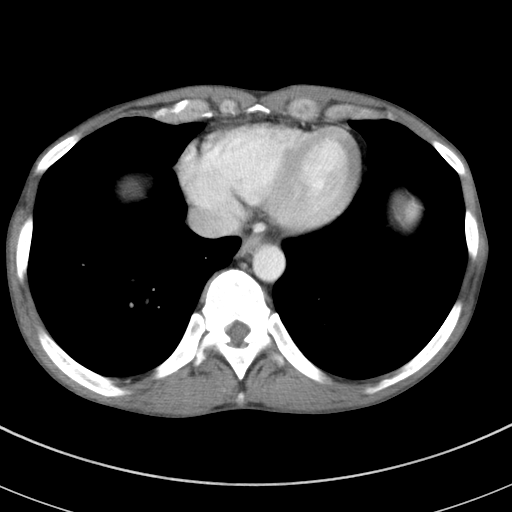
[im 85/96  lung]
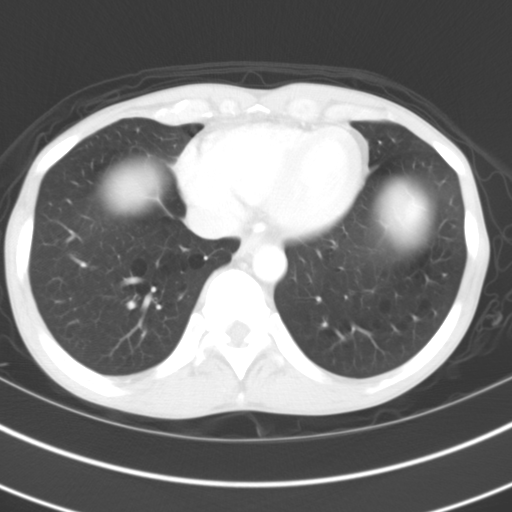
[im 90/96  soft-tissue]
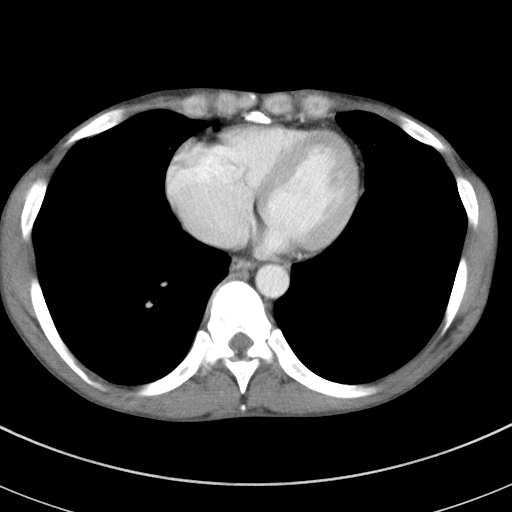
[im 90/96  lung]
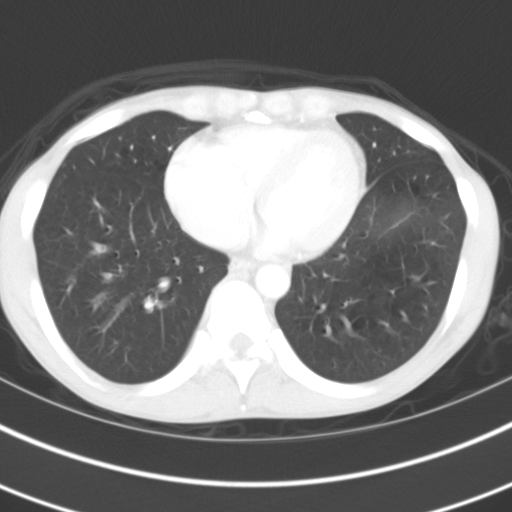

[14 of 32 positions shown; findings below may reference images not displayed]

FINDINGS: Sagittal images of the spine are unremarkable. Again noted bilateral
hip prosthesis. Extensive expansile osteolytic changes bilateral
hips surrounding the prosthesis again noted consistent with severe
particle disease. Enhanced liver is unremarkable. Mild distended
gallbladder. No calcified gallstones are noted within gallbladder.
No pericholecystic fluid. No CBD dilatation. The pancreas, spleen
and adrenal glands are unremarkable. Abdominal aorta is
unremarkable. Kidneys are symmetrical in size and enhancement. No
hydronephrosis or hydroureter. Moderate stool noted in right colon.
Some colonic stool noted in descending colon.

No small bowel obstruction. No ascites or free air. No adenopathy.
Evaluation of the pelvis is limited by metallic artifact from
bilateral hip prosthesis. There is no inguinal adenopathy. No
pericecal inflammation. Normal appendix partially visualized in
axial image 67.

Delayed renal images shows bilateral renal symmetrical excretion.
Bilateral visualized proximal ureter is unremarkable.
IMPRESSION: 1. No acute inflammatory process within abdomen or pelvis.
2. No small bowel obstruction.
3. No hydronephrosis or hydroureter.
4. Normal appendix partially visualized.
5. Bilateral hip prosthesis. Again noted osteo lytic changes
surrounding bilateral hip prosthesis consistent with particle
disease.

## 2016-09-29 ENCOUNTER — Encounter (HOSPITAL_BASED_OUTPATIENT_CLINIC_OR_DEPARTMENT_OTHER): Payer: Self-pay | Admitting: Emergency Medicine

## 2016-09-29 ENCOUNTER — Inpatient Hospital Stay (HOSPITAL_BASED_OUTPATIENT_CLINIC_OR_DEPARTMENT_OTHER)
Admission: EM | Admit: 2016-09-29 | Discharge: 2016-10-01 | DRG: 378 | Disposition: A | Discharge status: Home / Self Care | Payer: 59 | Attending: Internal Medicine | Admitting: Internal Medicine

## 2016-09-29 DIAGNOSIS — K264 Chronic or unspecified duodenal ulcer with hemorrhage: Principal | ICD-10-CM | POA: Diagnosis present

## 2016-09-29 DIAGNOSIS — K2970 Gastritis, unspecified, without bleeding: Secondary | ICD-10-CM | POA: Diagnosis present

## 2016-09-29 DIAGNOSIS — D62 Acute posthemorrhagic anemia: Secondary | ICD-10-CM | POA: Diagnosis not present

## 2016-09-29 DIAGNOSIS — Z833 Family history of diabetes mellitus: Secondary | ICD-10-CM | POA: Diagnosis not present

## 2016-09-29 DIAGNOSIS — K922 Gastrointestinal hemorrhage, unspecified: Secondary | ICD-10-CM | POA: Diagnosis not present

## 2016-09-29 DIAGNOSIS — Z8711 Personal history of peptic ulcer disease: Secondary | ICD-10-CM

## 2016-09-29 DIAGNOSIS — F1010 Alcohol abuse, uncomplicated: Secondary | ICD-10-CM | POA: Diagnosis not present

## 2016-09-29 DIAGNOSIS — F17210 Nicotine dependence, cigarettes, uncomplicated: Secondary | ICD-10-CM | POA: Diagnosis present

## 2016-09-29 DIAGNOSIS — K920 Hematemesis: Secondary | ICD-10-CM | POA: Diagnosis present

## 2016-09-29 DIAGNOSIS — Z96642 Presence of left artificial hip joint: Secondary | ICD-10-CM | POA: Diagnosis present

## 2016-09-29 LAB — COMPREHENSIVE METABOLIC PANEL
ALBUMIN: 3.7 g/dL (ref 3.5–5.0)
ALBUMIN: 3 g/dL — AB (ref 3.5–5.0)
ALT: 21 U/L (ref 17–63)
ALT: 19 U/L (ref 17–63)
ANION GAP: 14 (ref 5–15)
ANION GAP: 7 (ref 5–15)
AST: 46 U/L — AB (ref 15–41)
AST: 30 U/L (ref 15–41)
Alkaline Phosphatase: 71 U/L (ref 38–126)
Alkaline Phosphatase: 46 U/L (ref 38–126)
BILIRUBIN TOTAL: 0.5 mg/dL (ref 0.3–1.2)
BUN: 28 mg/dL — AB (ref 6–20)
BUN: 26 mg/dL — ABNORMAL HIGH (ref 6–20)
CHLORIDE: 104 mmol/L (ref 101–111)
CHLORIDE: 111 mmol/L (ref 101–111)
CO2: 21 mmol/L — ABNORMAL LOW (ref 22–32)
CO2: 25 mmol/L (ref 22–32)
Calcium: 8.7 mg/dL — ABNORMAL LOW (ref 8.9–10.3)
Calcium: 8.2 mg/dL — ABNORMAL LOW (ref 8.9–10.3)
Creatinine, Ser: 0.9 mg/dL (ref 0.61–1.24)
Creatinine, Ser: 0.92 mg/dL (ref 0.61–1.24)
GFR calc Af Amer: >60 mL/min (ref >60)
GFR calc non Af Amer: >60 mL/min (ref >60)
GFR calc non Af Amer: >60 mL/min (ref >60)
GLUCOSE: 117 mg/dL — AB (ref 65–99)
GLUCOSE: 92 mg/dL (ref 65–99)
POTASSIUM: 3.9 mmol/L (ref 3.5–5.1)
POTASSIUM: 4.8 mmol/L (ref 3.5–5.1)
SODIUM: 139 mmol/L (ref 135–145)
SODIUM: 143 mmol/L (ref 135–145)
TOTAL PROTEIN: 6.3 g/dL — AB (ref 6.5–8.1)
Total Bilirubin: 0.3 mg/dL (ref 0.3–1.2)
Total Protein: 5.2 g/dL — ABNORMAL LOW (ref 6.5–8.1)

## 2016-09-29 LAB — TROPONIN I

## 2016-09-29 LAB — CBC WITH DIFFERENTIAL/PLATELET
Basophils Absolute: 0 10*3/uL (ref 0.0–0.1)
Basophils Relative: 0 %
EOS PCT: 0 %
Eosinophils Absolute: 0 10*3/uL (ref 0.0–0.7)
HCT: 19.8 % — ABNORMAL LOW (ref 39.0–52.0)
Hemoglobin: 6.7 g/dL — CL (ref 13.0–17.0)
LYMPHS PCT: 24 %
Lymphs Abs: 2.1 10*3/uL (ref 0.7–4.0)
MCH: 32.2 pg (ref 26.0–34.0)
MCHC: 33.8 g/dL (ref 30.0–36.0)
MCV: 95.2 fL (ref 78.0–100.0)
Monocytes Absolute: 0.8 10*3/uL (ref 0.1–1.0)
Monocytes Relative: 9 %
NEUTROS ABS: 6 10*3/uL (ref 1.7–7.7)
Neutrophils Relative %: 67 %
PLATELETS: 240 10*3/uL (ref 150–400)
RBC: 2.08 MIL/uL — AB (ref 4.22–5.81)
RDW: 12.3 % (ref 11.5–15.5)
WBC: 8.9 10*3/uL (ref 4.0–10.5)

## 2016-09-29 LAB — CBC
HEMATOCRIT: 15.8 % — AB (ref 39.0–52.0)
Hemoglobin: 5.5 g/dL — CL (ref 13.0–17.0)
MCH: 32.5 pg (ref 26.0–34.0)
MCHC: 34.8 g/dL (ref 30.0–36.0)
MCV: 93.5 fL (ref 78.0–100.0)
Platelets: 165 10*3/uL (ref 150–400)
RBC: 1.69 MIL/uL — ABNORMAL LOW (ref 4.22–5.81)
RDW: 13.4 % (ref 11.5–15.5)
WBC: 7.3 10*3/uL (ref 4.0–10.5)

## 2016-09-29 LAB — PREPARE RBC (CROSSMATCH)

## 2016-09-29 LAB — PROTIME-INR
INR: 0.98
PROTHROMBIN TIME: 13 s (ref 11.4–15.2)

## 2016-09-29 LAB — RETICULOCYTES
RBC.: 1.69 MIL/uL — AB (ref 4.22–5.81)
RETIC COUNT ABSOLUTE: 64.2 10*3/uL (ref 19.0–186.0)
Retic Ct Pct: 3.8 % — ABNORMAL HIGH (ref 0.4–3.1)

## 2016-09-29 LAB — ABO/RH: ABO/RH(D): O POS

## 2016-09-29 LAB — MRSA PCR SCREENING: MRSA BY PCR: NEGATIVE

## 2016-09-29 MED ORDER — PANTOPRAZOLE SODIUM 40 MG IV SOLR
8.0000 mg/h | INTRAVENOUS | Status: DC
Start: 2016-09-29 — End: 2016-09-30
  Administered 2016-09-29 – 2016-09-30 (×3): 8 mg/h via INTRAVENOUS
  Filled 2016-09-29 (×3): qty 80

## 2016-09-29 MED ORDER — SODIUM CHLORIDE 0.9 % IV SOLN
INTRAVENOUS | Status: DC
  Administered 2016-09-29: 22:00:00 via INTRAVENOUS

## 2016-09-29 MED ORDER — PANTOPRAZOLE SODIUM 40 MG IV SOLR: 40.0000 mg | Freq: Two times a day (BID) | INTRAVENOUS | Status: DC

## 2016-09-29 MED ORDER — OCTREOTIDE LOAD VIA INFUSION
50.0000 ug | Freq: Once | INTRAVENOUS | Status: AC
  Administered 2016-09-29: 50 ug via INTRAVENOUS
  Filled 2016-09-29: qty 25

## 2016-09-29 MED ORDER — SODIUM CHLORIDE 0.9 % IV BOLUS (SEPSIS)
1000.0000 mL | Freq: Once | INTRAVENOUS | Status: AC
Start: 2016-09-29 — End: 2016-09-29
  Administered 2016-09-29: 1000 mL via INTRAVENOUS

## 2016-09-29 MED ORDER — SODIUM CHLORIDE 0.9 % IV SOLN
Freq: Once | INTRAVENOUS | Status: AC
  Administered 2016-09-29: 22:00:00 via INTRAVENOUS

## 2016-09-29 MED ORDER — OCTREOTIDE ACETATE 50 MCG/ML IJ SOLN
INTRAMUSCULAR | Status: AC
  Filled 2016-09-29: qty 1

## 2016-09-29 MED ORDER — LORAZEPAM 2 MG/ML IJ SOLN: 2.0000 mg | INTRAMUSCULAR | Status: DC | PRN

## 2016-09-29 MED ORDER — OCTREOTIDE ACETATE 50 MCG/ML IJ SOLN
INTRAMUSCULAR | Status: AC
  Filled 2016-09-29: qty 9

## 2016-09-29 MED ORDER — ONDANSETRON HCL 4 MG PO TABS: 4.0000 mg | ORAL_TABLET | Freq: Four times a day (QID) | ORAL | Status: DC | PRN

## 2016-09-29 MED ORDER — SODIUM CHLORIDE 0.9 % IV SOLN
50.0000 ug/h | INTRAVENOUS | Status: DC
  Filled 2016-09-29 (×3): qty 1

## 2016-09-29 MED ORDER — SODIUM CHLORIDE 0.9 % IV BOLUS (SEPSIS): 1000.0000 mL | Freq: Once | INTRAVENOUS | Status: DC

## 2016-09-29 MED ORDER — PANTOPRAZOLE SODIUM 40 MG IV SOLR
40.0000 mg | Freq: Once | INTRAVENOUS | Status: AC
  Administered 2016-09-29: 40 mg via INTRAVENOUS
  Filled 2016-09-29: qty 40

## 2016-09-29 MED ORDER — THIAMINE HCL 100 MG/ML IJ SOLN
100.0000 mg | Freq: Every day | INTRAMUSCULAR | Status: DC
  Administered 2016-09-29 – 2016-09-30 (×2): 100 mg via INTRAVENOUS
  Filled 2016-09-29 (×2): qty 2

## 2016-09-29 MED ORDER — NICOTINE 21 MG/24HR TD PT24
21.0000 mg | MEDICATED_PATCH | Freq: Every day | TRANSDERMAL | Status: DC
  Administered 2016-09-29 – 2016-10-01 (×3): 21 mg via TRANSDERMAL
  Filled 2016-09-29 (×3): qty 1

## 2016-09-29 MED ORDER — ACETAMINOPHEN 325 MG PO TABS
650.0000 mg | ORAL_TABLET | Freq: Once | ORAL | Status: AC
  Administered 2016-09-29: 650 mg via ORAL
  Filled 2016-09-29: qty 2

## 2016-09-29 MED ORDER — DIPHENHYDRAMINE HCL 25 MG PO CAPS
25.0000 mg | ORAL_CAPSULE | Freq: Once | ORAL | Status: AC
  Administered 2016-09-29: 25 mg via ORAL
  Filled 2016-09-29: qty 1

## 2016-09-29 MED ORDER — CEFTRIAXONE SODIUM 1 G IJ SOLR
1.0000 g | Freq: Once | INTRAMUSCULAR | Status: AC
  Administered 2016-09-29: 1 g via INTRAVENOUS
  Filled 2016-09-29: qty 10

## 2016-09-29 MED ORDER — FOLIC ACID 5 MG/ML IJ SOLN
1.0000 mg | Freq: Every day | INTRAMUSCULAR | Status: DC
  Administered 2016-09-29 – 2016-09-30 (×2): 1 mg via INTRAVENOUS
  Filled 2016-09-29 (×2): qty 0.2

## 2016-09-29 MED ORDER — ONDANSETRON HCL 4 MG/2ML IJ SOLN: 4.0000 mg | Freq: Four times a day (QID) | INTRAMUSCULAR | Status: DC | PRN

## 2016-09-29 NOTE — H&P (Signed)
Juan Moses ZOX:[id number] DOB: [birth year] DOA: 09/29/2016     [facility]: [facility]   Outpatient Specialists:  none   Patient coming from:   home Lives With family   Chief Complaint:  Blood in stools nausea and vomiting   HPI: Juan Moses is a 46 y.o. male with medical history significant of Alcohol abuse    Presented with blood mixed with stool since yesterday associated with nausea and vomiting. Reports stools looking maroon. Reports vomiting bright red blood today he has history of bleeding ulcers in the past which happen in [place] 10 years ago and  he has no local GI follow-up. Patient continues to drink about 2 X 40s per day. He drinks liquor on occasion. Reports not sure if he have had the shakes because he has hardly ever stopped drinking.  No hx of seizures Reports since arrival to emergency department haven't had any more bloody bowel movements over hematemesis.  Patient's home medications include mobic  and naproxen haven'  used it in 6 months.  Regarding pertinent Chronic problems: Denies history of hypertension or diabetes   IN ER:  Temp (24hrs), Avg:98.6 F (37 C), Min:98.1 F (36.7 C), Max:99.4 F (37.4 C)      on arrival  ED Triage Vitals  Enc Vitals Group     BP 09/29/16 1403 99/81     Pulse Rate 09/29/16 1403 (!) 118     Resp 09/29/16 1403 18     Temp 09/29/16 1403 98.2 F (36.8 C)     Temp Source 09/29/16 1403 Oral     SpO2 09/29/16 1403 100 %     Weight 09/29/16 1403 160 lb (72.6 kg)     Height 09/29/16 1403 6' (1.829 m)     Head Circumference --      Peak Flow --      Pain Score 09/29/16 1406 10     Pain Loc --      Pain Edu? --      Excl. in GC? --   RR16 100% HR 98  BP 107/74  Na 139 K 3.9 BUN 28 Cr 0.9 WBC 8.9 Hg 6.7 PLT 240 INR 0.98 Following Medications were ordered in ER: Medications  octreotide (SANDOSTATIN) 2 mcg/mL load via infusion 50 mcg (50 mcg Intravenous Bolus from Bag 09/29/16 1652)    And  octreotide (SANDOSTATIN) 500  mcg in sodium chloride 0.9 % 250 mL (2 mcg/mL) infusion (not administered)  octreotide (SANDOSTATIN) 50 MCG/ML injection (  Not Given 09/29/16 1706)  octreotide (SANDOSTATIN) 50 MCG/ML injection (not administered)  sodium chloride 0.9 % bolus 1,000 mL (0 mLs Intravenous Stopped 09/29/16 1702)  pantoprazole (PROTONIX) injection 40 mg (40 mg Intravenous Given 09/29/16 1549)  cefTRIAXone (ROCEPHIN) 1 g in dextrose 5 % 50 mL IVPB (0 g Intravenous Stopped 09/29/16 1659)     ER provider discussed case with:  [facility] who recommended octreotide and Protonix drip and they'll see patient in the morning  Hospitalist was called for admission for upper GI bleeding with history of bleeding ulcers and ongoing alcohol abuse  Review of Systems:    Pertinent positives include: abdominal pain, nausea, vomiting, blood in stool, hematemesis Constitutional:  No weight loss, night sweats, Fevers, chills, fatigue, weight loss  HEENT:  No headaches, Difficulty swallowing,Tooth/dental problems,Sore throat,  No sneezing, itching, ear ache, nasal congestion, post nasal drip,  Cardio-vascular:  No chest pain, Orthopnea, PND, anasarca, dizziness, palpitations.no Bilateral lower extremity swelling  GI:  No heartburn,  indigestion,  diarrhea, change in bowel habits, loss of appetite, melena,  Resp:  no shortness of breath at rest. No dyspnea on exertion, No excess mucus, no productive cough, No non-productive cough, No coughing up of blood.No change in color of mucus.No wheezing. Skin:  no rash or lesions. No jaundice GU:  no dysuria, change in color of urine, no urgency or frequency. No straining to urinate.  No flank pain.  Musculoskeletal:  No joint pain or no joint swelling. No decreased range of motion. No back pain.  Psych:  No change in mood or affect. No depression or anxiety. No memory loss.  Neuro: no localizing neurological complaints, no tingling, no weakness, no double vision, no gait abnormality, no  slurred speech, no confusion  As per HPI otherwise 10 point review of systems negative.   Past Medical History: History reviewed. No pertinent past medical history. Past Surgical History:  Procedure Laterality Date  . TOTAL HIP ARTHROPLASTY Left      Social History:  Ambulatory  independently    reports that he has been smoking Cigarettes.  He has been smoking about 1.00 pack per day. He has never used smokeless tobacco. He reports that he drinks about 12.6 oz of alcohol per week . He reports that he does not use drugs.  Allergies:   Allergies  Allergen Reactions  . Morphine And Related Itching       Family History:   Family History  Problem Relation Age of Onset  . Diabetes Other   . Hypertension Neg Hx   . Cancer Neg Hx   . Alcohol abuse Neg Hx   . CAD Neg Hx     Medications: Prior to Admission medications   Medication Sig Start Date End Date Taking? Authorizing Provider  HYDROcodone-acetaminophen (NORCO/VICODIN) 5-325 MG per tablet Take 1-2 tablets by mouth every 6 (six) hours as needed for moderate pain. 05/17/13   Molpus, John, MD  HYDROcodone-acetaminophen (NORCO/VICODIN) 5-325 MG per tablet Take 1-2 tablets by mouth every 4 (four) hours as needed. 12/06/13   Hess, Nada Boozer, PA-C  meloxicam (MOBIC) 15 MG tablet Take 1 tablet (15 mg total) by mouth daily. 12/27/14   Palumbo, April, MD  naproxen sodium (ANAPROX DS) 550 MG tablet Take 1 tablet every 12 hours for hip pain. Best taken with a meal. 05/17/13   Molpus, John, MD    Physical Exam: Patient Vitals for the past 24 hrs:  BP Temp Temp src Pulse Resp SpO2 Height Weight  09/29/16 2001 107/74 99.4 F (37.4 C) - 98 16 100 % - -  09/29/16 1830 118/82 - - - 14 - - -  09/29/16 1805 127/88 - - 96 18 100 % - -  09/29/16 1800 127/88 - - - 20 - - -  09/29/16 1630 110/70 - - (!) 102 (!) 8 100 % - -  09/29/16 1600 106/72 - - 96 18 100 % - -  09/29/16 1530 112/86 - - 100 (!) 23 100 % - -  09/29/16 1514 (!) 118/92 98.1  F (36.7 C) Oral (!) 108 13 100 % - -  09/29/16 1403 99/81 98.2 F (36.8 C) Oral (!) 118 18 100 % 6' (1.829 m) 72.6 kg (160 lb)    1. General:  in No Acute distress 2. Psychological: Alert and   Oriented 3. Head/ENT:    Dry Mucous Membranes  Head Non traumatic, neck supple                           Poor Dentition 4. SKIN:   decreased Skin turgor,  Skin clean Dry and intact no rash 5. Heart: Regular rate and rhythm no  Murmur, Rub or gallop 6. Lungs: no wheezes or crackles   7. Abdomen: Soft, mildly tender in epigastrium , Non distended 8. Lower extremities: no clubbing, cyanosis, or edema 9. Neurologically Grossly intact, moving all 4 extremities equally  10. MSK: Normal range of motion   body mass index is 21.7 kg/m.  Labs on Admission:   Labs on Admission: I have personally reviewed following labs and imaging studies  CBC:  Recent Labs Lab 09/29/16 1516  WBC 8.9  NEUTROABS 6.0  HGB 6.7*  HCT 19.8*  MCV 95.2  PLT 240   Basic Metabolic Panel:  Recent Labs Lab 09/29/16 1516  NA 139  K 3.9  CL 104  CO2 21*  GLUCOSE 117*  BUN 28*  CREATININE 0.90  CALCIUM 8.7*   GFR: Estimated Creatinine Clearance: 104.2 mL/min (by C-G formula based on SCr of 0.9 mg/dL). Liver Function Tests:  Recent Labs Lab 09/29/16 1516  AST 46*  ALT 21  ALKPHOS 71  BILITOT 0.5  PROT 6.3*  ALBUMIN 3.7   No results for input(s): LIPASE, AMYLASE in the last 168 hours. No results for input(s): AMMONIA in the last 168 hours. Coagulation Profile:  Recent Labs Lab 09/29/16 1516  INR 0.98   Cardiac Enzymes: No results for input(s): CKTOTAL, CKMB, CKMBINDEX, TROPONINI in the last 168 hours. BNP (last 3 results) No results for input(s): PROBNP in the last 8760 hours. HbA1C: No results for input(s): HGBA1C in the last 72 hours. CBG: No results for input(s): GLUCAP in the last 168 hours. Lipid Profile: No results for input(s): CHOL, HDL, LDLCALC, TRIG,  CHOLHDL, LDLDIRECT in the last 72 hours. Thyroid Function Tests: No results for input(s): TSH, T4TOTAL, FREET4, T3FREE, THYROIDAB in the last 72 hours. Anemia Panel: No results for input(s): VITAMINB12, FOLATE, FERRITIN, TIBC, IRON, RETICCTPCT in the last 72 hours. Urine analysis:  Sepsis Labs: @LABRCNTIP (procalcitonin:4,lacticidven:4) )No results found for this or any previous visit (from the past 240 hour(s)).     UA  not ordered  No results found for: HGBA1C  Estimated Creatinine Clearance: 104.2 mL/min (by C-G formula based on SCr of 0.9 mg/dL).  BNP (last 3 results) No results for input(s): PROBNP in the last 8760 hours.   ECG REPORT  Independently reviewed Rate: 108  Rhythm: sinus tachycardia ST&T Change: No acute ischemic changes   QTC 433  Filed Weights   09/29/16 1403  Weight: 72.6 kg (160 lb)     Cultures: No results found for: SDES, SPECREQUEST, CULT, REPTSTATUS   Radiological Exams on Admission: No results found.  Chart has been reviewed    Assessment/Plan  46 y.o. male with medical history significant of Alcohol abuse and bleeding ulcers    Admitted for hematemesis and frank blood in stools with anemia secondary to blood loss  Present on Admission: . Acute GI hemorrhage -    -in the setting of    of PUD,   alcohol abuse, NSAID use    -   Admit to stepdown given hemodynamic instability    -  ER  Provider spoke to gastroenterology  they will see patient in a.m. appreciate their consult   - clear liquids for  tonight keep nothing by mouth post midnight,   -  administer Protonix  drip   - Administer octreotide    - serial CBC.    - Monitor for any recurrence,  evidence of hemodynamic instability or significant blood loss    . Alcohol abuse - CIWA protocol ordered , social work consult . Acute blood loss anemia - repeat CBC and notice serial CBC, type and screen, transfuse 2 units given significant drop in hemoglobin from baseline of 2 years ago  was 13.5 Tobacco abuse - order nicotine patch recommended QUITTING  Other plan as per orders.  DVT prophylaxis:  SCD    Code Status:  FULL CODE  as per patient    Family Communication:   Family not  at  Bedside  plan of care was discussed with girlfriend at bedside Disposition Plan:       To home once workup is complete and patient is stable    Social Work   consulted                          [facility]  Admission status:     inpatient     Level of care   SDU      I have spent a total of 56 min on this admission  Lloyd Cullinan 09/29/2016, 9:38 PM    [facility]  Pager [phone]   after 2 AM please page floor coverage PA If 7AM-7PM, please contact the day team taking care of the patient  [email or web address]  Password [email or web address]

## 2016-09-29 NOTE — ED Notes (Signed)
Report given to Ginger, RN

## 2016-09-29 NOTE — ED Provider Notes (Signed)
[facility] Provider Note   CSN: [id number] Arrival date & time: 09/29/16  1354     History   Chief Complaint Chief Complaint  Patient presents with  . Rectal Bleeding    HPI Andee Polesrick Balicki is a 46 y.o. male.  HPI   Patient is a 46 year old male presenting with maroon stools. Patient reports that he's been actively stooling starting last night until this morning. Patient also had one episode of vomiting of bright red blood. Patient reports this has a history of "bleeding ulcers". This is in [place]. Patient has no GI follow up in the area. Patient is a daily drinker. At least 2 40s a day.    History reviewed. No pertinent past medical history.  Patient Active Problem List   Diagnosis Date Noted  . Acute GI hemorrhage 09/29/2016  . Alcohol abuse 09/29/2016  . Acute blood loss anemia 09/29/2016    Past Surgical History:  Procedure Laterality Date  . TOTAL HIP ARTHROPLASTY Left        Home Medications    Prior to Admission medications   Medication Sig Start Date End Date Taking? Authorizing Provider  diphenhydramine-acetaminophen (TYLENOL PM) 25-500 MG TABS tablet Take 2 tablets by mouth at bedtime as needed (sleep).   Yes [provider]  Magnesium Sulfate, Laxative, (EPSOM SALT PO) Take 30 mLs by mouth daily as needed (laxative).   Yes [provider]  HYDROcodone-acetaminophen (NORCO/VICODIN) 5-325 MG per tablet Take 1-2 tablets by mouth every 6 (six) hours as needed for moderate pain. Patient not taking: Reported on 09/29/2016 05/17/13   Molpus, John, MD  HYDROcodone-acetaminophen (NORCO/VICODIN) 5-325 MG per tablet Take 1-2 tablets by mouth every 4 (four) hours as needed. Patient not taking: Reported on 09/29/2016 12/06/13   Hess, Nada Boozerobyn M, PA-C  meloxicam (MOBIC) 15 MG tablet Take 1 tablet (15 mg total) by mouth daily. Patient not taking: Reported on 09/29/2016 12/27/14   Palumbo, April, MD  naproxen sodium (ANAPROX DS) 550 MG tablet Take  1 tablet every 12 hours for hip pain. Best taken with a meal. Patient not taking: Reported on 09/29/2016 05/17/13   Molpus, Jonny RuizJohn, MD    Family History Family History  Problem Relation Age of Onset  . Diabetes Other   . Hypertension Neg Hx   . Cancer Neg Hx   . Alcohol abuse Neg Hx   . CAD Neg Hx     Social History Social History  Substance Use Topics  . Smoking status: Current Every Day Smoker    Packs/day: 1.00    Types: Cigarettes  . Smokeless tobacco: Never Used  . Alcohol use 12.6 oz/week    21 Cans of beer per week     Allergies   Morphine and related   Review of Systems Review of Systems  Constitutional: Negative for activity change.  Respiratory: Negative for shortness of breath.   Cardiovascular: Negative for chest pain.  Gastrointestinal: Positive for blood in stool. Negative for abdominal pain.  Neurological: Positive for dizziness.  All other systems reviewed and are negative.    Physical Exam Updated Vital Signs BP 113/76   Pulse 96   Temp 99 F (37.2 C) (Oral)   Resp 11   Ht 6' (1.829 m)   Wt 72.6 kg (160 lb)   SpO2 100%   BMI 21.70 kg/m   Physical Exam  Constitutional: He is oriented to person, place, and time. He appears well-nourished.  HENT:  Head: Normocephalic.  Pelvic conjunctiva.  Eyes: Conjunctivae  are normal.  Cardiovascular: Normal rate.   Pulmonary/Chest: Effort normal and breath sounds normal. No respiratory distress.  Abdominal: There is tenderness.  Patient reports mild cramping pain diffusely.  Musculoskeletal: Normal range of motion.  Neurological: He is oriented to person, place, and time.  Skin: Skin is warm and dry. He is not diaphoretic.  Psychiatric: He has a normal mood and affect. His behavior is normal.     ED Treatments / Results  Labs (all labs ordered are listed, but only abnormal results are displayed) Labs Reviewed  COMPREHENSIVE METABOLIC PANEL - Abnormal; Notable for the following:       Result  Value   CO2 21 (*)    Glucose, Bld 117 (*)    BUN 28 (*)    Calcium 8.7 (*)    Total Protein 6.3 (*)    AST 46 (*)    All other components within normal limits  CBC WITH DIFFERENTIAL/PLATELET - Abnormal; Notable for the following:    RBC 2.08 (*)    Hemoglobin 6.7 (*)    HCT 19.8 (*)    All other components within normal limits  CBC - Abnormal; Notable for the following:    RBC 1.69 (*)    Hemoglobin 5.5 (*)    HCT 15.8 (*)    All other components within normal limits  COMPREHENSIVE METABOLIC PANEL - Abnormal; Notable for the following:    BUN 26 (*)    Calcium 8.2 (*)    Total Protein 5.2 (*)    Albumin 3.0 (*)    All other components within normal limits  RETICULOCYTES - Abnormal; Notable for the following:    Retic Ct Pct 3.8 (*)    RBC. 1.69 (*)    All other components within normal limits  MRSA PCR SCREENING  PROTIME-INR  TROPONIN I  HIV ANTIBODY (ROUTINE TESTING)  MAGNESIUM  PHOSPHORUS  TSH  COMPREHENSIVE METABOLIC PANEL  CBC  CBC  PROTIME-INR  TROPONIN I  TROPONIN I  VITAMIN B12  FOLATE  IRON AND TIBC  FERRITIN  TYPE AND SCREEN  PREPARE RBC (CROSSMATCH)  ABO/RH    EKG  EKG Interpretation None       Radiology No results found.  Procedures Procedures (including critical care time)  Medications Ordered in ED Medications  octreotide (SANDOSTATIN) 2 mcg/mL load via infusion 50 mcg (50 mcg Intravenous Bolus from Bag 09/29/16 1652)    And  octreotide (SANDOSTATIN) 500 mcg in sodium chloride 0.9 % 250 mL (2 mcg/mL) infusion (50 mcg/hr Intravenous Given by EMS 09/29/16 2144)  octreotide (SANDOSTATIN) 50 MCG/ML injection (  Not Given 09/29/16 1706)  octreotide (SANDOSTATIN) 50 MCG/ML injection (not administered)  ondansetron (ZOFRAN) tablet 4 mg (not administered)    Or  ondansetron (ZOFRAN) injection 4 mg (not administered)  acetaminophen (TYLENOL) tablet 650 mg (not administered)  diphenhydrAMINE (BENADRYL) capsule 25 mg (not administered)  0.9 %   sodium chloride infusion ( Intravenous New Bag/Given 09/29/16 2145)  LORazepam (ATIVAN) injection 2-3 mg (not administered)  thiamine (B-1) injection 100 mg (100 mg Intravenous Given 09/29/16 2249)  folic acid injection 1 mg (1 mg Intravenous Given 09/29/16 2146)  pantoprazole (PROTONIX) 80 mg in sodium chloride 0.9 % 250 mL (0.32 mg/mL) infusion (8 mg/hr Intravenous New Bag/Given 09/29/16 2145)  pantoprazole (PROTONIX) injection 40 mg (not administered)  nicotine (NICODERM CQ - dosed in mg/24 hours) patch 21 mg (21 mg Transdermal Patch Applied 09/29/16 2225)  sodium chloride 0.9 % bolus 1,000 mL (0 mLs Intravenous  Stopped 09/29/16 1702)  pantoprazole (PROTONIX) injection 40 mg (40 mg Intravenous Given 09/29/16 1549)  cefTRIAXone (ROCEPHIN) 1 g in dextrose 5 % 50 mL IVPB (0 g Intravenous Stopped 09/29/16 1659)  pantoprazole (PROTONIX) injection 40 mg (40 mg Intravenous Given 09/29/16 2223)  0.9 %  sodium chloride infusion ( Intravenous New Bag/Given 09/29/16 2146)     Initial Impression / Assessment and Plan / ED Course  I have reviewed the triage vital signs and the nursing notes.  Pertinent labs & imaging results that were available during my care of the patient were reviewed by me and considered in my medical decision making (see chart for details).     Patient is a 46 year old male which resenting with what appears to be a GI bleed. Patient tachycardic, nor intensive. We'll get labs, treat for upper GI bleed. Patient has no known varices, however given history will adminitster abx and octreotide.   Discussed with [facility].  Will admit.    Final Clinical Impressions(s) / ED Diagnoses   Final diagnoses:  None    New Prescriptions Current Discharge Medication List       Abelino Derrick, MD 09/29/16 2308

## 2016-09-29 NOTE — ED Notes (Signed)
Medication mixture verified by [facility]Jen at [facility]

## 2016-09-29 NOTE — ED Notes (Signed)
Date and time results received: 09/29/16  1540  Test: CBC Critical Value:HGB 6.7  Name of Provider Notified: Dr Corlis LeakMackuen  Orders Received? Or Actions Taken?:

## 2016-09-29 NOTE — ED Triage Notes (Signed)
Patient states that he started to have blood mixed with stool last night. The patient reports he has had frequent other BM's since with Abdominal cramping. Reports N/V

## 2016-09-29 NOTE — Progress Notes (Signed)
Discussed with EDP and accepted for admit from [facility].  Patient is a 46 year old male with history of alcohol abuse and "bleeding ulcers" presenting with history of formed stools mixed with bright red blood since since last night Hb 6.7 in ED and [facility] consulted. Admit to SDU for obs overningt and transfer to tele in AM

## 2016-09-30 ENCOUNTER — Inpatient Hospital Stay (HOSPITAL_COMMUNITY): Payer: 59 | Admitting: Anesthesiology

## 2016-09-30 ENCOUNTER — Encounter (HOSPITAL_COMMUNITY): Payer: Self-pay | Admitting: Certified Registered"

## 2016-09-30 ENCOUNTER — Inpatient Hospital Stay (HOSPITAL_COMMUNITY): Payer: 59 | Admitting: Certified Registered"

## 2016-09-30 ENCOUNTER — Encounter (HOSPITAL_COMMUNITY)
Admission: EM | Disposition: A | Discharge status: Home / Self Care | Payer: Self-pay | Source: Home / Self Care | Attending: Internal Medicine

## 2016-09-30 DIAGNOSIS — D62 Acute posthemorrhagic anemia: Secondary | ICD-10-CM

## 2016-09-30 DIAGNOSIS — F1010 Alcohol abuse, uncomplicated: Secondary | ICD-10-CM

## 2016-09-30 DIAGNOSIS — K922 Gastrointestinal hemorrhage, unspecified: Secondary | ICD-10-CM

## 2016-09-30 HISTORY — PX: ESOPHAGOGASTRODUODENOSCOPY (EGD) WITH PROPOFOL: SHX5813

## 2016-09-30 LAB — CBC
HEMATOCRIT: 21.7 % — AB (ref 39.0–52.0)
HEMATOCRIT: 21.3 % — AB (ref 39.0–52.0)
HEMATOCRIT: 20.8 % — AB (ref 39.0–52.0)
HEMOGLOBIN: 7.5 g/dL — AB (ref 13.0–17.0)
Hemoglobin: 7.4 g/dL — ABNORMAL LOW (ref 13.0–17.0)
Hemoglobin: 7.2 g/dL — ABNORMAL LOW (ref 13.0–17.0)
MCH: 31.6 pg (ref 26.0–34.0)
MCH: 31.8 pg (ref 26.0–34.0)
MCH: 32.1 pg (ref 26.0–34.0)
MCHC: 34.6 g/dL (ref 30.0–36.0)
MCHC: 34.7 g/dL (ref 30.0–36.0)
MCHC: 34.6 g/dL (ref 30.0–36.0)
MCV: 91.6 fL (ref 78.0–100.0)
MCV: 91.4 fL (ref 78.0–100.0)
MCV: 92.9 fL (ref 78.0–100.0)
PLATELETS: 142 10*3/uL — AB (ref 150–400)
Platelets: 130 10*3/uL — ABNORMAL LOW (ref 150–400)
Platelets: 137 10*3/uL — ABNORMAL LOW (ref 150–400)
RBC: 2.37 MIL/uL — AB (ref 4.22–5.81)
RBC: 2.33 MIL/uL — ABNORMAL LOW (ref 4.22–5.81)
RBC: 2.24 MIL/uL — AB (ref 4.22–5.81)
RDW: 14.5 % (ref 11.5–15.5)
RDW: 14.8 % (ref 11.5–15.5)
RDW: 15.1 % (ref 11.5–15.5)
WBC: 5.1 10*3/uL (ref 4.0–10.5)
WBC: 4.8 10*3/uL (ref 4.0–10.5)
WBC: 4.6 10*3/uL (ref 4.0–10.5)

## 2016-09-30 LAB — IRON AND TIBC
Iron: 145 ug/dL (ref 45–182)
SATURATION RATIOS: 72 % — AB (ref 17.9–39.5)
TIBC: 202 ug/dL — AB (ref 250–450)
UIBC: 57 ug/dL

## 2016-09-30 LAB — COMPREHENSIVE METABOLIC PANEL
ALT: 17 U/L (ref 17–63)
ANION GAP: 7 (ref 5–15)
AST: 34 U/L (ref 15–41)
Albumin: 2.7 g/dL — ABNORMAL LOW (ref 3.5–5.0)
Alkaline Phosphatase: 44 U/L (ref 38–126)
BILIRUBIN TOTAL: 0.7 mg/dL (ref 0.3–1.2)
BUN: 22 mg/dL — ABNORMAL HIGH (ref 6–20)
CHLORIDE: 110 mmol/L (ref 101–111)
CO2: 23 mmol/L (ref 22–32)
Calcium: 7.5 mg/dL — ABNORMAL LOW (ref 8.9–10.3)
Creatinine, Ser: 0.88 mg/dL (ref 0.61–1.24)
Glucose, Bld: 129 mg/dL — ABNORMAL HIGH (ref 65–99)
POTASSIUM: 4 mmol/L (ref 3.5–5.1)
Sodium: 140 mmol/L (ref 135–145)
TOTAL PROTEIN: 4.7 g/dL — AB (ref 6.5–8.1)

## 2016-09-30 LAB — VITAMIN B12: Vitamin B-12: 210 pg/mL (ref 180–914)

## 2016-09-30 LAB — TSH: TSH: 0.25 u[IU]/mL — AB (ref 0.350–4.500)

## 2016-09-30 LAB — PHOSPHORUS: PHOSPHORUS: 3.9 mg/dL (ref 2.5–4.6)

## 2016-09-30 LAB — FOLATE: Folate: 14.5 ng/mL (ref >5.9)

## 2016-09-30 LAB — PREPARE RBC (CROSSMATCH)

## 2016-09-30 LAB — FERRITIN: FERRITIN: 178 ng/mL (ref 24–336)

## 2016-09-30 LAB — PROTIME-INR
INR: 1.15
Prothrombin Time: 14.8 seconds (ref 11.4–15.2)

## 2016-09-30 LAB — HIV ANTIBODY (ROUTINE TESTING W REFLEX): HIV SCREEN 4TH GENERATION: NONREACTIVE

## 2016-09-30 LAB — MAGNESIUM: MAGNESIUM: 1.5 mg/dL — AB (ref 1.7–2.4)

## 2016-09-30 SURGERY — CANCELLED PROCEDURE

## 2016-09-30 SURGERY — EGD (ESOPHAGOGASTRODUODENOSCOPY)
Anesthesia: Monitor Anesthesia Care

## 2016-09-30 SURGERY — ESOPHAGOGASTRODUODENOSCOPY (EGD) WITH PROPOFOL
Anesthesia: Monitor Anesthesia Care

## 2016-09-30 MED ORDER — LACTATED RINGERS IV SOLN
INTRAVENOUS | Status: DC
  Administered 2016-09-30: 11:00:00 via INTRAVENOUS

## 2016-09-30 MED ORDER — SODIUM CHLORIDE 0.9 % IV SOLN: INTRAVENOUS | Status: DC

## 2016-09-30 MED ORDER — SODIUM CHLORIDE 0.9 % IV SOLN: Freq: Once | INTRAVENOUS | Status: AC

## 2016-09-30 MED ORDER — PANTOPRAZOLE SODIUM 40 MG IV SOLR
40.0000 mg | Freq: Two times a day (BID) | INTRAVENOUS | Status: DC
  Administered 2016-09-30 – 2016-10-01 (×3): 40 mg via INTRAVENOUS
  Filled 2016-09-30 (×3): qty 40

## 2016-09-30 MED ORDER — LIDOCAINE 2% (20 MG/ML) 5 ML SYRINGE
INTRAMUSCULAR | Status: DC | PRN
  Administered 2016-09-30: 100 mg via INTRAVENOUS

## 2016-09-30 MED ORDER — PROPOFOL 10 MG/ML IV BOLUS
INTRAVENOUS | Status: AC
  Filled 2016-09-30: qty 40

## 2016-09-30 MED ORDER — FOLIC ACID 1 MG PO TABS
1.0000 mg | ORAL_TABLET | Freq: Every day | ORAL | Status: DC
  Administered 2016-10-01: 1 mg via ORAL
  Filled 2016-09-30: qty 1

## 2016-09-30 MED ORDER — PROPOFOL 500 MG/50ML IV EMUL
INTRAVENOUS | Status: DC | PRN
  Administered 2016-09-30: 150 ug/kg/min via INTRAVENOUS

## 2016-09-30 MED ORDER — PROPOFOL 10 MG/ML IV BOLUS
INTRAVENOUS | Status: DC | PRN
  Administered 2016-09-30 (×2): 40 mg via INTRAVENOUS

## 2016-09-30 MED ORDER — VITAMIN B-1 100 MG PO TABS
100.0000 mg | ORAL_TABLET | Freq: Every day | ORAL | Status: DC
  Administered 2016-10-01: 100 mg via ORAL
  Filled 2016-09-30: qty 1

## 2016-09-30 MED ORDER — LIDOCAINE 2% (20 MG/ML) 5 ML SYRINGE
INTRAMUSCULAR | Status: AC
Start: 2016-09-30 — End: ?
  Filled 2016-09-30: qty 5

## 2016-09-30 SURGICAL SUPPLY — 14 items

## 2016-09-30 NOTE — Anesthesia Preprocedure Evaluation (Signed)
Anesthesia Evaluation  Patient identified by MRN, date of birth, ID band Patient awake    Reviewed: Allergy & Precautions, NPO status , Patient's Chart, lab work & pertinent test results  History of Anesthesia Complications Negative for: history of anesthetic complications  Airway Mallampati: II  TM Distance: >3 FB Neck ROM: Full    Dental  (+) Caps,    Pulmonary neg shortness of breath, neg sleep apnea, neg COPD, neg recent URI, Current Smoker,    breath sounds clear to auscultation       Cardiovascular negative cardio ROS   Rhythm:Regular     Neuro/Psych negative neurological ROS  negative psych ROS   GI/Hepatic PUD, (+)     substance abuse  alcohol use, Possible gi bleed   Endo/Other  negative endocrine ROS  Renal/GU negative Renal ROS     Musculoskeletal negative musculoskeletal ROS (+)   Abdominal   Peds  Hematology  (+) anemia ,   Anesthesia Other Findings   Reproductive/Obstetrics                             Anesthesia Physical Anesthesia Plan  ASA: II and emergent  Anesthesia Plan: MAC   Post-op Pain Management:    Induction: Intravenous  PONV Risk Score and Plan: 0  Airway Management Planned: Nasal Cannula  Additional Equipment: None  Intra-op Plan:   Post-operative Plan:   Informed Consent: I have reviewed the patients History and Physical, chart, labs and discussed the procedure including the risks, benefits and alternatives for the proposed anesthesia with the patient or authorized representative who has indicated his/her understanding and acceptance.   Dental advisory given  Plan Discussed with: CRNA and Surgeon  Anesthesia Plan Comments:         Anesthesia Quick Evaluation

## 2016-09-30 NOTE — Consult Note (Signed)
Referring Provider:  ER Primary Care Physician:  [facility] Primary Gastroenterologist:  Gentry Fitz  Reason for Consultation:  GI bleed  HPI: Juan Moses is a 46 y.o. male [facility] with complaining of blood in the stool. Patient with history of peptic ulcer disease in the past. Had EGD in [place] around 10 years ago. He was doing fine until 2-3 days ago when he started noticing dark black color stool which was followed by maroon colored stool. Patient also had 1 episode of vomiting 2 days ago and he had seen some streaks of fresh blood. He was complaining of abdominal distention and bloating but denied any abdominal pain. His last bowel movement was yesterday morning. Last vomiting was 2 days ago. Was found to have tachycardia on initial evaluation as well as hemoglobin of 6.7 on initial evaluation. Patient with history of daily alcohol use. He also uses a NSAIDs/BC powder  at least once or twice a week for a long time.  No previous colonoscopy. No family history of colon cancer or colon polyp  History reviewed. No pertinent past medical history.  Past Surgical History:  Procedure Laterality Date  . TOTAL HIP ARTHROPLASTY Left     Prior to Admission medications   Medication Sig Start Date End Date Taking? Authorizing Provider  diphenhydramine-acetaminophen (TYLENOL PM) 25-500 MG TABS tablet Take 2 tablets by mouth at bedtime as needed (sleep).   Yes [provider]  Magnesium Sulfate, Laxative, (EPSOM SALT PO) Take 30 mLs by mouth daily as needed (laxative).   Yes [provider]  HYDROcodone-acetaminophen (NORCO/VICODIN) 5-325 MG per tablet Take 1-2 tablets by mouth every 6 (six) hours as needed for moderate pain. Patient not taking: Reported on 09/29/2016 05/17/13   Molpus, John, MD  HYDROcodone-acetaminophen (NORCO/VICODIN) 5-325 MG per tablet Take 1-2 tablets by mouth every 4 (four) hours as needed. Patient not taking:  Reported on 09/29/2016 12/06/13   Hess, Nada Boozer, PA-C  meloxicam (MOBIC) 15 MG tablet Take 1 tablet (15 mg total) by mouth daily. Patient not taking: Reported on 09/29/2016 12/27/14   Palumbo, April, MD  naproxen sodium (ANAPROX DS) 550 MG tablet Take 1 tablet every 12 hours for hip pain. Best taken with a meal. Patient not taking: Reported on 09/29/2016 05/17/13   Molpus, John, MD    Scheduled Meds: . folic acid  1 mg Intravenous Daily  . nicotine  21 mg Transdermal Daily  . [START ON 10/03/2016] pantoprazole  40 mg Intravenous Q12H  . thiamine  100 mg Intravenous Daily   Continuous Infusions: . sodium chloride 125 mL/hr at 09/29/16 2145  . octreotide  (SANDOSTATIN)    IV infusion    . pantoprozole (PROTONIX) infusion 8 mg/hr (09/30/16 0716)   PRN Meds:.LORazepam, ondansetron **OR** ondansetron (ZOFRAN) IV  Allergies as of 09/29/2016 - Review Complete 09/29/2016  Allergen Reaction Noted  . Morphine and related Itching 05/16/2013    Family History  Problem Relation Age of Onset  . Diabetes Other   . Hypertension Neg Hx   . Cancer Neg Hx   . Alcohol abuse Neg Hx   . CAD Neg Hx     Social History   Social History  . Marital status: Single    Spouse name: N/A  . Number of children: N/A  . Years of education: N/A   Occupational History  . Not on file.   Social History Main Topics  . Smoking status: Current Every Day Smoker  Packs/day: 1.00    Types: Cigarettes  . Smokeless tobacco: Never Used  . Alcohol use 12.6 oz/week    21 Cans of beer per week  . Drug use: No  . Sexual activity: Not on file   Other Topics Concern  . Not on file   Social History Narrative  . No narrative on file    Review of Systems: Review of Systems  Constitutional: Negative for chills and fever.  HENT: Negative for ear discharge, ear pain, hearing loss and tinnitus.   Eyes: Negative for blurred vision and double vision.  Respiratory: Negative for cough, hemoptysis and sputum production.    Cardiovascular: Negative for chest pain and palpitations.  Gastrointestinal: Positive for blood in stool, heartburn, melena, nausea and vomiting.  Genitourinary: Negative for dysuria and urgency.  Musculoskeletal: Positive for back pain. Negative for myalgias.  Skin: Negative for itching and rash.  Neurological: Positive for dizziness and weakness. Negative for seizures and loss of consciousness.  Endo/Heme/Allergies: Does not bruise/bleed easily.  Psychiatric/Behavioral: Negative for hallucinations and suicidal ideas.    Physical Exam: Vital signs: Vitals:   09/30/16 0615 09/30/16 0746  BP: 113/82   Pulse:    Resp:    Temp:  98.5 F (36.9 C)  SpO2:     Last BM Date: 09/29/16 Physical Exam  Constitutional: He is oriented to person, place, and time. He appears well-developed and well-nourished. No distress.  HENT:  Head: Normocephalic and atraumatic.  Mouth/Throat: No oropharyngeal exudate.  Eyes: EOM are normal. No scleral icterus.  Neck: Normal range of motion. Neck supple. No thyromegaly present.  Cardiovascular: Normal rate, regular rhythm and normal heart sounds.   Pulmonary/Chest: Effort normal and breath sounds normal. No respiratory distress.  Abdominal: Soft. Bowel sounds are normal. He exhibits no distension. There is no tenderness. There is no guarding.  Musculoskeletal: Normal range of motion. He exhibits no edema.  Neurological: He is alert and oriented to person, place, and time.  Skin: Skin is warm. No erythema.  Psychiatric: He has a normal mood and affect. His behavior is normal. Thought content normal.    GI:  Lab Results:  Recent Labs  09/29/16 1516 09/29/16 2126  WBC 8.9 7.3  HGB 6.7* 5.5*  HCT 19.8* 15.8*  PLT 240 165   BMET  Recent Labs  09/29/16 1516 09/29/16 2126  NA 139 143  K 3.9 4.8  CL 104 111  CO2 21* 25  GLUCOSE 117* 92  BUN 28* 26*  CREATININE 0.90 0.92  CALCIUM 8.7* 8.2*   LFT  Recent Labs  09/29/16 2126  PROT  5.2*  ALBUMIN 3.0*  AST 30  ALT 19  ALKPHOS 46  BILITOT 0.3   PT/INR  Recent Labs  09/29/16 1516 09/30/16 0706  LABPROT 13.0 14.8  INR 0.98 1.15     Studies/Results: No results found.  Impression/Plan: - Melena along with maroon color stool as well as streaks of fresh blood in the vomiting. Most likely upper GI bleed from ulcer disease. Patient with history of alcohol use but has a normal LFTs, INR and no evidence of liver disease. Variceal bleed is less likely but also remains in the differential. - Acute blood loss anemia. Hemoglobin 5.5. Repeat pending - Alcohol use  Recommendations -------------------------- - EGD today for further evaluation. Risk benefits alternatives discussed with the patient. Verbalized understanding. Patient was advised to avoid NSAIDs. Alcohol abstinence discussed with the patient. - Continue Protonix drip, octreotide drip and antibiotics for now. - Monitor H&H  Transfuse to keep hemoglobin more than 7. - GI will follow   LOS: 1 day   Kathi DerParag Kempton Milne  MD, FACP 09/30/2016, 8:00 AM  Pager [phone] If no answer or after 5 PM call [phone]

## 2016-09-30 NOTE — Transfer of Care (Signed)
Immediate Anesthesia Transfer of Care Note  Patient: Juan Moses  Procedure(s) Performed: Procedure(s): ESOPHAGOGASTRODUODENOSCOPY (EGD) WITH PROPOFOL (N/A)  Patient Location: PACU  Anesthesia Type:MAC  Level of Consciousness: awake, alert  and oriented  Airway & Oxygen Therapy: Patient Spontanous Breathing and Patient connected to nasal cannula oxygen  Post-op Assessment: Report given to RN and Post -op Vital signs reviewed and stable  Post vital signs: Reviewed and stable  Last Vitals:  Vitals:   09/30/16 1000 09/30/16 1100  BP: 122/84 114/87  Pulse: 80 76  Resp: 18 14  Temp:  36.7 C  SpO2: 100% 100%    Last Pain:  Vitals:   09/30/16 1100  TempSrc: Oral  PainSc:          Complications: No apparent anesthesia complications

## 2016-09-30 NOTE — Progress Notes (Signed)
PROGRESS NOTE    Juan Moses  ZOX:[id number] DOB: 1970/02/24 DOA: 09/29/2016 PCP: [facility]  Brief Narrative:Kareem Jelinski is a 46 y.o. male with medical history significant of Alcohol abuse, tobacco abuse and history of peptic ulcer disease many years ago and [place]. Presented with blood mixed with stool since yesterday associated with nausea and vomiting. Reports stools looking black and maroon. Reports vomiting bright red blood 8/11. Patient continues to drink about 2 X 40s per day. Hemoglobin dropped to 5.5  Assessment & Plan:   Active Problems:   Acute upper GI bleed -Suspected peptic ulcer disease versus gastritis less likely to be varices -Continue Protonix drip and octreotide pending EGD -[facility] consulting, plan for endoscopy this morning  Acute blood loss anemia -Secondary to above, transfuse 2 units of PRBC this morning -Monitor hemoglobin closely and transfuse when necessary    Alcohol abuse -Counseled, continued thiamine, monitor and severe protocol -No withdrawal noted    Tobacco abuse -Counseled, nicotine patch  DVT prophylaxis: SCDs Code Status: Full code Family Communication: Friend at bedside  Disposition Plan: Keep in stepdown today  Consultants:   [facility]   Subjective: Feels okay, denies any melena or hematemesis overnight  Objective: Vitals:   09/30/16 0700 09/30/16 0746 09/30/16 0800 09/30/16 1100  BP: 112/79  119/81 114/87  Pulse: 81  83 76  Resp: 12  10 14   Temp:  98.5 F (36.9 C)  98 F (36.7 C)  TempSrc:  Oral  Oral  SpO2: 100%  100% 100%  Weight:      Height:        Intake/Output Summary (Last 24 hours) at 09/30/16 1118 Last data filed at 09/30/16 0800  Gross per 24 hour  Intake             5530 ml  Output              450 ml  Net             5080 ml   Filed Weights   09/29/16 1403  Weight: 72.6 kg (160 lb)    Examination:  General exam: Appears calm and comfortable, no distress Respiratory  system: Clear to auscultation. Respiratory effort normal. Cardiovascular system: S1 & S2 heard, RRR. No JVD, murmurs Gastrointestinal system: Abdomen is nondistended, soft and nontender. Normal bowel sounds heard. Central nervous system: Alert and oriented. No focal neurological deficits, no tremors Extremities: Symmetric 5 x 5 power. Skin: No rashes, lesions or ulcers Psychiatry: Judgement and insight appear normal. Mood & affect appropriate.     Data Reviewed:   CBC:  Recent Labs Lab 09/29/16 1516 09/29/16 2126 09/30/16 0706 09/30/16 0913  WBC 8.9 7.3 5.1 4.8  NEUTROABS 6.0  --   --   --   HGB 6.7* 5.5* 7.5* 7.4*  HCT 19.8* 15.8* 21.7* 21.3*  MCV 95.2 93.5 91.6 91.4  PLT 240 165 130* 137*   Basic Metabolic Panel:  Recent Labs Lab 09/29/16 1516 09/29/16 2126 09/30/16 0706  NA 139 143 140  K 3.9 4.8 4.0  CL 104 111 110  CO2 21* 25 23  GLUCOSE 117* 92 129*  BUN 28* 26* 22*  CREATININE 0.90 0.92 0.88  CALCIUM 8.7* 8.2* 7.5*  MG  --   --  1.5*  PHOS  --   --  3.9   GFR: Estimated Creatinine Clearance: 106.6 mL/min (by C-G formula based on SCr of 0.88 mg/dL). Liver Function Tests:  Recent Labs Lab 09/29/16 1516 09/29/16  2126 09/30/16 0706  AST 46* 30 34  ALT 21 19 17   ALKPHOS 71 46 44  BILITOT 0.5 0.3 0.7  PROT 6.3* 5.2* 4.7*  ALBUMIN 3.7 3.0* 2.7*   No results for input(s): LIPASE, AMYLASE in the last 168 hours. No results for input(s): AMMONIA in the last 168 hours. Coagulation Profile:  Recent Labs Lab 09/29/16 1516 09/30/16 0706  INR 0.98 1.15   Cardiac Enzymes:  Recent Labs Lab 09/29/16 2126  TROPONINI <0.03   BNP (last 3 results) No results for input(s): PROBNP in the last 8760 hours. HbA1C: No results for input(s): HGBA1C in the last 72 hours. CBG: No results for input(s): GLUCAP in the last 168 hours. Lipid Profile: No results for input(s): CHOL, HDL, LDLCALC, TRIG, CHOLHDL, LDLDIRECT in the last 72 hours. Thyroid Function  Tests:  Recent Labs  09/30/16 0706  TSH 0.250*   Anemia Panel:  Recent Labs  09/29/16 2126  VITAMINB12 210  FOLATE 14.5  FERRITIN 178  TIBC 202*  IRON 145  RETICCTPCT 3.8*   Urine analysis:    Component Value Date/Time   COLORURINE YELLOW 08/18/2014 2100   APPEARANCEUR CLEAR 08/18/2014 2100   LABSPEC 1.004 (L) 08/18/2014 2100   PHURINE 5.5 08/18/2014 2100   GLUCOSEU NEGATIVE 08/18/2014 2100   HGBUR NEGATIVE 08/18/2014 2100   BILIRUBINUR NEGATIVE 08/18/2014 2100   KETONESUR NEGATIVE 08/18/2014 2100   PROTEINUR NEGATIVE 08/18/2014 2100   UROBILINOGEN 0.2 08/18/2014 2100   NITRITE NEGATIVE 08/18/2014 2100   LEUKOCYTESUR NEGATIVE 08/18/2014 2100   Sepsis Labs: @LABRCNTIP (procalcitonin:4,lacticidven:4)  ) Recent Results (from the past 240 hour(s))  MRSA PCR Screening     Status: None   Collection Time: 09/29/16  8:56 PM  Result Value Ref Range Status   MRSA by PCR NEGATIVE NEGATIVE Final    Comment:        The GeneXpert MRSA Assay (FDA approved for NASAL specimens only), is one component of a comprehensive MRSA colonization surveillance program. It is not intended to diagnose MRSA infection nor to guide or monitor treatment for MRSA infections.          Radiology Studies: No results found.      Scheduled Meds: . [MAR Hold] folic acid  1 mg Intravenous Daily  . [MAR Hold] nicotine  21 mg Transdermal Daily  . [MAR Hold] pantoprazole  40 mg Intravenous Q12H  . [MAR Hold] thiamine  100 mg Intravenous Daily   Continuous Infusions: . sodium chloride 75 mL/hr at 09/30/16 0800  . lactated ringers 20 mL/hr at 09/30/16 1111  . octreotide  (SANDOSTATIN)    IV infusion    . pantoprozole (PROTONIX) infusion 8 mg/hr (09/30/16 0800)     LOS: 1 day    Time spent: 35min  Zannie CovePreetha Elizabella Nolet, MD [facility] Pager [phone]  If 7PM-7AM, please contact night-coverage [email or web address] Password [email or web address] 09/30/2016, 11:18 AM

## 2016-09-30 NOTE — Progress Notes (Signed)
PHARMACIST - PHYSICIAN COMMUNICATION  DR:   Jomarie LongsJoseph  CONCERNING: IV to Oral Route Change Policy  RECOMMENDATION: This patient is receiving thiamine and folic acid by the intravenous route.  Based on criteria approved by the Pharmacy and Therapeutics Committee, the intravenous medication(s) is/are being converted to the equivalent oral dose form(s).   DESCRIPTION: These criteria include:  The patient is eating (either orally or via tube) and/or has been taking other orally administered medications for a least 24 hours  The patient has no evidence of active gastrointestinal bleeding or impaired GI absorption (gastrectomy, short bowel, patient on TNA or NPO).  If you have questions about this conversion, please contact the Pharmacy Department  []   [phone]( [phone] )  [facility] []   [phone]( [phone] )  [facility] []   [phone]( [phone] )  [facility] []   [phone]( [phone] )  [facility] [x]   [phone]( [phone] )  [facility]   Len ChildsBell, Hebe Merriwether T, [facility] 09/30/2016 1:19 PM

## 2016-09-30 NOTE — Brief Op Note (Signed)
09/29/2016 - 09/30/2016  11:31 AM  PATIENT:  Juan Moses  46 y.o. male  PRE-OPERATIVE DIAGNOSIS:  GI bleed  POST-OPERATIVE DIAGNOSIS:  dudenal ulcer  PROCEDURE:  Procedure(s): ESOPHAGOGASTRODUODENOSCOPY (EGD) WITH PROPOFOL (N/A)  SURGEON:  Surgeon(s) and Role:    * Rainer Mounce, MD - Primary  Findings/recommendations ------------------------------------- - EGD showed clean-based duodenal bulb ulcer. No evidence of active bleeding. - No esophageal or gastric varices. - Change PPI to IV twice a day. DC octreotide and antibiotics. - Monitor hemoglobin. - Soft diet, advance as tolerated - Avoid NSAIDs. - GI will follow. Possible discharge tomorrow if hemoglobin remains stable.     Kathi DerParag Connelly [place] MD, FACP 09/30/2016, 11:32 AM  Pager [phone]  If no answer or after 5 PM call [phone]

## 2016-10-01 LAB — CBC
HCT: 20.4 % — ABNORMAL LOW (ref 39.0–52.0)
HCT: 25.9 % — ABNORMAL LOW (ref 39.0–52.0)
Hemoglobin: 7.1 g/dL — ABNORMAL LOW (ref 13.0–17.0)
Hemoglobin: 9 g/dL — ABNORMAL LOW (ref 13.0–17.0)
MCH: 32.3 pg (ref 26.0–34.0)
MCH: 31.9 pg (ref 26.0–34.0)
MCHC: 34.8 g/dL (ref 30.0–36.0)
MCHC: 34.7 g/dL (ref 30.0–36.0)
MCV: 92.7 fL (ref 78.0–100.0)
MCV: 91.8 fL (ref 78.0–100.0)
PLATELETS: 166 10*3/uL (ref 150–400)
PLATELETS: 190 10*3/uL (ref 150–400)
RBC: 2.2 MIL/uL — ABNORMAL LOW (ref 4.22–5.81)
RBC: 2.82 MIL/uL — AB (ref 4.22–5.81)
RDW: 15.1 % (ref 11.5–15.5)
RDW: 14.6 % (ref 11.5–15.5)
WBC: 6.2 10*3/uL (ref 4.0–10.5)
WBC: 5.8 10*3/uL (ref 4.0–10.5)

## 2016-10-01 LAB — BASIC METABOLIC PANEL
Anion gap: 5 (ref 5–15)
BUN: 14 mg/dL (ref 6–20)
CALCIUM: 7.8 mg/dL — AB (ref 8.9–10.3)
CO2: 23 mmol/L (ref 22–32)
Chloride: 112 mmol/L — ABNORMAL HIGH (ref 101–111)
Creatinine, Ser: 1.03 mg/dL (ref 0.61–1.24)
GFR calc Af Amer: >60 mL/min (ref >60)
GLUCOSE: 98 mg/dL (ref 65–99)
POTASSIUM: 3.6 mmol/L (ref 3.5–5.1)
Sodium: 140 mmol/L (ref 135–145)

## 2016-10-01 LAB — PREPARE RBC (CROSSMATCH)

## 2016-10-01 MED ORDER — FERROUS SULFATE 325 (65 FE) MG PO TABS: 325.0000 mg | ORAL_TABLET | Freq: Two times a day (BID) | ORAL | 3 refills | Status: AC

## 2016-10-01 MED ORDER — SODIUM CHLORIDE 0.9 % IV SOLN
Freq: Once | INTRAVENOUS | Status: AC
  Administered 2016-10-01: 10:00:00 via INTRAVENOUS

## 2016-10-01 MED ORDER — PANTOPRAZOLE SODIUM 40 MG PO TBEC: 40.0000 mg | DELAYED_RELEASE_TABLET | Freq: Two times a day (BID) | ORAL | 0 refills | Status: AC

## 2016-10-01 NOTE — Op Note (Signed)
[facility] Patient Name: Juan Moses Procedure Date: 09/30/2016 MRN: [id number] Attending MD: Kathi Der , MD Date of Birth: 22-Jan-1971 CSN: [id number] Age: 46 Admit Type: Inpatient Procedure:                Upper GI endoscopy Indications:              Hematemesis, Melena Providers:                Kathi Der, MD, Roselie Awkward, RN, Oletha Blend, Technician Referring MD:              Medicines:                Sedation Administered by an Anesthesia Professional Complications:            No immediate complications. Estimated Blood Loss:     Estimated blood loss was minimal. Procedure:                Pre-Anesthesia Assessment:                           - Prior to the procedure, a History and Physical                            was performed, and patient medications and                            allergies were reviewed. The patient's tolerance of                            previous anesthesia was also reviewed. The risks                            and benefits of the procedure and the sedation                            options and risks were discussed with the patient.                            All questions were answered, and informed consent                            was obtained. Prior Anticoagulants: The patient has                            taken no previous anticoagulant or antiplatelet                            agents. ASA Grade Assessment: II - A patient with                            mild systemic disease. After reviewing the risks  and benefits, the patient was deemed in                            satisfactory condition to undergo the procedure.                           After obtaining informed consent, the endoscope was                            passed under direct vision. Throughout the                            procedure, the patient's blood pressure, pulse, and          oxygen saturations were monitored continuously. The                            [id number]) scope was introduced through the                            mouth, and advanced to the second part of duodenum.                            The upper GI endoscopy was accomplished without                            difficulty. The patient tolerated the procedure                            well. Scope In: Scope Out: Findings:      The Z-line was regular and was found 38 cm from the incisors.      There is no endoscopic evidence of ulcerations or varices in the entire       esophagus.      Scattered minimal inflammation characterized by erythema was found in       the gastric antrum and in the prepyloric region of the stomach. Biopsies       were taken with a cold forceps for histology.      The cardia and gastric fundus were normal on retroflexion.      One non-bleeding superficial duodenal ulcer with no stigmata of bleeding       was found in the duodenal bulb.      The first portion of the duodenum and second portion of the duodenum       were normal. Impression:               - Z-line regular, 38 cm from the incisors.                           - Gastritis. Biopsied.                           - One non-bleeding duodenal ulcer with no stigmata                            of bleeding.                           -  Normal first portion of the duodenum and second                            portion of the duodenum. Moderate Sedation:      Moderate (conscious) sedation was personally administered by an       anesthesia professional. The following parameters were monitored: oxygen       saturation, heart rate, blood pressure, and response to care. Recommendation:           - Return patient to hospital ward for ongoing care.                           - Soft diet.                           - Continue present medications.                           - Await pathology results. Procedure Code(s):         --- Professional ---                           43239, [phone], flexible,                            transoral; with biopsy, single or multiple Diagnosis Code(s):        --- Professional ---                           K29.70, Gastritis, unspecified, without bleeding                           K26.9, Duodenal ulcer, unspecified as acute or                            chronic, without hemorrhage or perforation                           K92.0, Hematemesis                           K92.1, Melena (includes Hematochezia) CPT copyright 2016 American Medical Association. All rights reserved. The codes documented in this report are preliminary and upon coder review may  be revised to meet current compliance requirements. Kathi DerParag Sachin Ferencz, MD Kathi DerParag [name], MD 09/30/2016 11:42:37 AM Number of Addenda: 0

## 2016-10-01 NOTE — Care Management Note (Addendum)
Case Management Note  Patient Details  Name: Juan Moses MRN: [id number] Date of Birth: [birth year]  Subjective/Objective:     ETOH abuse and upper gi bleed, requiring blood transfusion.               Action/Plan: Date:  October 01, 2016 Chart reviewed for concurrent status and case management needs. Will continue to follow patient progress. Discharge Planning: following for needs Expected discharge date: [id number] Marcelle SmilingRhonda Joram Venson, BSN, [place]3, [place]   [phone]  Expected Discharge Date:                  Expected Discharge Plan:  Home/Self Care  In-House Referral:     Discharge planning Services  CM Consult  Post Acute Care Choice:    Choice offered to:     DME Arranged:    DME Agency:     HH Arranged:    HH Agency:     Status of Service:  In process, will continue to follow  If discussed at Long Length of Stay Meetings, dates discussed:    Additional Comments:  Golda AcreDavis, Shequilla Goodgame Lynn, RN 10/01/2016, 8:59 AM

## 2016-10-01 NOTE — Discharge Summary (Signed)
Physician Discharge Summary  Juan Moses ZOX:[id number] DOB: Jan 22, 1971 DOA: 09/29/2016  [facility]P: [facility]  Admit date: 09/29/2016 Discharge date: 10/01/2016  Time spent: 35 minutes  Recommendations for Outpatient Follow-up:  1. PCP in 1 week, please monitor CBC in 1-2weeks 2. Dr.Brahmbhatt GI in 2weeks, FU Gastric biopsy results   Discharge Diagnoses:  Active Problems:   Acute GI bleed   Duodenal ulcer   Acute blood loss anemia   Alcohol abuse  Discharge Condition: stable  Diet recommendation: regular  Filed Weights   09/29/16 1403 10/01/16 0422  Weight: 72.6 kg (160 lb) 67.4 kg (148 lb 9.4 oz)    History of present illness:  Juan Moses a 46 y.o.malewith medical history significant of Alcohol abuse, tobacco abuse and history of peptic ulcer disease many years ago and [place]. Presented with blood mixed with stool since yesterday associated with nausea and vomiting. Reports stools looking black and maroon. Reports vomiting bright red blood 8/11. Patient continues to drink about 2 X 40s per day. Hemoglobin dropped to 5.5  Hospital Course:  Acute upper GI bleed -due to duodenal ulcer -underwent EGD yesterday which showed One non-bleeding duodenal ulcer with no stigmata of bleeding. -stable since without further bleeding, s/p 3 units PRBC, hb responded appropriately -discharged home on PPI and Iron -advised to stop alcohol and FU with Gi for biopsy results  Acute blood loss anemia -Secondary to above, transfuse 3 units of PRBC -improved, no further bleeding -needs CBC in 1-2weeks    Alcohol abuse -Counseled, treated with thiamine -No withdrawal noted    Tobacco abuse -Counseled, nicotine patch  Procedures: EGD 8/12: Impression:               - Z-line regular, 38 cm from the incisors.                           - Gastritis. Biopsied.                           - One non-bleeding duodenal ulcer with no stigmata                            of  bleeding.                           - Normal first portion of the duodenum and second                             portion of the duodenum.  Consultations:  Enid Baas  Discharge Exam: Vitals:   10/01/16 1005 10/01/16 1148  BP: 113/62 109/78  Pulse: 88 75  Resp: 14 13  Temp: 98.3 F (36.8 C) 98.1 F (36.7 C)  SpO2: 95% 100%    General: AAOx3 Cardiovascular: S1S2/RRR Respiratory: CTAB  Discharge Instructions   Discharge Instructions    Diet - low sodium heart healthy    Complete by:  As directed    Increase activity slowly    Complete by:  As directed      Current Discharge Medication List    START taking these medications   Details  ferrous sulfate 325 (65 FE) MG tablet Take 1 tablet (325 mg total) by mouth 2 (two) times daily with a meal. Qty: 60 tablet, Refills: 3  pantoprazole (PROTONIX) 40 MG tablet Take 1 tablet (40 mg total) by mouth 2 (two) times daily before a meal. Qty: 60 tablet, Refills: 0      CONTINUE these medications which have NOT CHANGED   Details  diphenhydramine-acetaminophen (TYLENOL PM) 25-500 MG TABS tablet Take 2 tablets by mouth at bedtime as needed (sleep).    Magnesium Sulfate, Laxative, (EPSOM SALT PO) Take 30 mLs by mouth daily as needed (laxative).      STOP taking these medications     HYDROcodone-acetaminophen (NORCO/VICODIN) 5-325 MG per tablet      HYDROcodone-acetaminophen (NORCO/VICODIN) 5-325 MG per tablet      meloxicam (MOBIC) 15 MG tablet      naproxen sodium (ANAPROX DS) 550 MG tablet        Allergies  Allergen Reactions  . Morphine And Related Itching   Follow-up Information    [facility]. Schedule an appointment as soon as possible for a visit in 1 week(s).   Contact information: [address] [place] [address] [phone]            The results of significant diagnostics from this hospitalization (including imaging, microbiology, ancillary and laboratory) are listed below  for reference.    Significant Diagnostic Studies: No results found.  Microbiology: Recent Results (from the past 240 hour(s))  MRSA PCR Screening     Status: None   Collection Time: 09/29/16  8:56 PM  Result Value Ref Range Status   MRSA by PCR NEGATIVE NEGATIVE Final    Comment:        The GeneXpert MRSA Assay (FDA approved for NASAL specimens only), is one component of a comprehensive MRSA colonization surveillance program. It is not intended to diagnose MRSA infection nor to guide or monitor treatment for MRSA infections.      Labs: Basic Metabolic Panel:  Recent Labs Lab 09/29/16 1516 09/29/16 2126 09/30/16 0706 10/01/16 0339  NA 139 143 140 140  K 3.9 4.8 4.0 3.6  CL 104 111 110 112*  CO2 21* 25 23 23   GLUCOSE 117* 92 129* 98  BUN 28* 26* 22* 14  CREATININE 0.90 0.92 0.88 1.03  CALCIUM 8.7* 8.2* 7.5* 7.8*  MG  --   --  1.5*  --   PHOS  --   --  3.9  --    Liver Function Tests:  Recent Labs Lab 09/29/16 1516 09/29/16 2126 09/30/16 0706  AST 46* 30 34  ALT 21 19 17   ALKPHOS 71 46 44  BILITOT 0.5 0.3 0.7  PROT 6.3* 5.2* 4.7*  ALBUMIN 3.7 3.0* 2.7*   No results for input(s): LIPASE, AMYLASE in the last 168 hours. No results for input(s): AMMONIA in the last 168 hours. CBC:  Recent Labs Lab 09/29/16 1516 09/29/16 2126 09/30/16 0706 09/30/16 0913 09/30/16 1501 10/01/16 0339  WBC 8.9 7.3 5.1 4.8 4.6 6.2  NEUTROABS 6.0  --   --   --   --   --   HGB 6.7* 5.5* 7.5* 7.4* 7.2* 7.1*  HCT 19.8* 15.8* 21.7* 21.3* 20.8* 20.4*  MCV 95.2 93.5 91.6 91.4 92.9 92.7  PLT 240 165 130* 137* 142* 166   Cardiac Enzymes:  Recent Labs Lab 09/29/16 2126  TROPONINI <0.03   BNP: BNP (last 3 results) No results for input(s): BNP in the last 8760 hours.  ProBNP (last 3 results) No results for input(s): PROBNP in the last 8760 hours.  CBG: No results for input(s): GLUCAP in the last  168 hours.     SignedZannie Cove:  Yahsir Wickens MD.  [facility] 10/01/2016, 1:29 PM

## 2016-10-01 NOTE — Progress Notes (Signed)
[facility] Progress Note  Subjective: The patient feels good today. Eating well. No signs of active bleeding.  Objective: Vital signs in last 24 hours: Temp:  [98.1 F (36.7 C)-98.6 F (37 C)] 98.1 F (36.7 C) (08/13 1148) Pulse Rate:  [71-88] 75 (08/13 1148) Resp:  [8-19] 13 (08/13 1148) BP: (107-128)/(62-96) 109/78 (08/13 1148) SpO2:  [95 %-100 %] 100 % (08/13 1148) Weight:  [67.4 kg (148 lb 9.4 oz)] 67.4 kg (148 lb 9.4 oz) (08/13 0422) Weight change: -5.176 kg (-11 lb 6.6 oz)   PE:  No distress  Abdomen soft and nontender  Lab Results: Results for orders placed or performed during the hospital encounter of 09/29/16 (from the past 24 hour(s))  CBC     Status: Abnormal   Collection Time: 09/30/16  3:01 PM  Result Value Ref Range   WBC 4.6 4.0 - 10.5 K/uL   RBC 2.24 (L) 4.22 - 5.81 MIL/uL   Hemoglobin 7.2 (L) 13.0 - 17.0 g/dL   HCT 09.820.8 (L) 11.939.0 - 14.752.0 %   MCV 92.9 78.0 - 100.0 fL   MCH 32.1 26.0 - 34.0 pg   MCHC 34.6 30.0 - 36.0 g/dL   RDW 82.915.1 56.211.5 - 13.015.5 %   Platelets 142 (L) 150 - 400 K/uL  Basic metabolic panel     Status: Abnormal   Collection Time: 10/01/16  3:39 AM  Result Value Ref Range   Sodium 140 135 - 145 mmol/L   Potassium 3.6 3.5 - 5.1 mmol/L   Chloride 112 (H) 101 - 111 mmol/L   CO2 23 22 - 32 mmol/L   Glucose, Bld 98 65 - 99 mg/dL   BUN 14 6 - 20 mg/dL   Creatinine, Ser 8.651.03 0.61 - 1.24 mg/dL   Calcium 7.8 (L) 8.9 - 10.3 mg/dL   GFR calc non Af Amer >60 >60 mL/min   GFR calc Af Amer >60 >60 mL/min   Anion gap 5 5 - 15  CBC     Status: Abnormal   Collection Time: 10/01/16  3:39 AM  Result Value Ref Range   WBC 6.2 4.0 - 10.5 K/uL   RBC 2.20 (L) 4.22 - 5.81 MIL/uL   Hemoglobin 7.1 (L) 13.0 - 17.0 g/dL   HCT 78.420.4 (L) 69.639.0 - 29.552.0 %   MCV 92.7 78.0 - 100.0 fL   MCH 32.3 26.0 - 34.0 pg   MCHC 34.8 30.0 - 36.0 g/dL   RDW 28.415.1 13.211.5 - 44.015.5 %   Platelets 166 150 - 400 K/uL  Prepare RBC     Status: None   Collection Time: 10/01/16   8:00 AM  Result Value Ref Range   Order Confirmation ORDER PROCESSED BY BLOOD BANK     Studies/Results: No results found.    Assessment: Duodenal ulcer and gastritis  Plan:   Continue PPI therapy, avoid alcohol    Juan Moses 10/01/2016, 1:20 PM  Pager: [phone] If no answer or after 5 PM call [phone] Lab Results  Component Value Date   HGB 7.1 (L) 10/01/2016   HGB 7.2 (L) 09/30/2016   HGB 7.4 (L) 09/30/2016   HCT 20.4 (L) 10/01/2016   HCT 20.8 (L) 09/30/2016   HCT 21.3 (L) 09/30/2016   ALKPHOS 44 09/30/2016   ALKPHOS 46 09/29/2016   ALKPHOS 71 09/29/2016   AST 34 09/30/2016   AST 30 09/29/2016   AST 46 (H) 09/29/2016   ALT 17 09/30/2016   ALT 19 09/29/2016   ALT 21 09/29/2016

## 2016-10-02 ENCOUNTER — Encounter (HOSPITAL_COMMUNITY): Payer: Self-pay | Admitting: General Practice

## 2016-10-02 NOTE — Anesthesia Postprocedure Evaluation (Signed)
Anesthesia Post Note  Patient: Venetia Maxon  Procedure(s) Performed: Procedure(s) (LRB): ESOPHAGOGASTRODUODENOSCOPY (EGD) WITH PROPOFOL (N/A)     Patient location during evaluation: Endoscopy Anesthesia Type: MAC Level of consciousness: awake and alert Pain management: pain level controlled Vital Signs Assessment: post-procedure vital signs reviewed and stable Respiratory status: spontaneous breathing, nonlabored ventilation, respiratory function stable and patient connected to nasal cannula oxygen Cardiovascular status: stable and blood pressure returned to baseline Postop Assessment: no signs of nausea or vomiting Anesthetic complications: no    Last Vitals:  Vitals:   10/01/16 1148 10/01/16 1600  BP: 109/78   Pulse: 75   Resp: 13   Temp: 36.7 C 36.8 C  SpO2: 100%     Last Pain:  Vitals:   10/01/16 1600  TempSrc: Oral  PainSc:                  Latandra Loureiro

## 2016-10-03 LAB — TYPE AND SCREEN
ABO/RH(D): O POS
ANTIBODY SCREEN: NEGATIVE
UNIT DIVISION: 0
UNIT DIVISION: 0
Unit division: 0
Unit division: 0

## 2016-10-03 LAB — BPAM RBC
BLOOD PRODUCT EXPIRATION DATE: 201809122359
BLOOD PRODUCT EXPIRATION DATE: 201809132359
Blood Product Expiration Date: 201809122359
Blood Product Expiration Date: 201809122359
ISSUE DATE / TIME: 201808112345
ISSUE DATE / TIME: 201808120217
ISSUE DATE / TIME: 201808130934
UNIT TYPE AND RH: 5100
Unit Type and Rh: 5100
Unit Type and Rh: 5100
Unit Type and Rh: 5100

## 2021-06-25 ENCOUNTER — Emergency Department (HOSPITAL_BASED_OUTPATIENT_CLINIC_OR_DEPARTMENT_OTHER)
Admission: EM | Admit: 2021-06-25 | Discharge: 2021-06-25 | Disposition: A | Discharge status: Home / Self Care | Payer: 59 | Attending: Emergency Medicine | Admitting: Emergency Medicine

## 2021-06-25 ENCOUNTER — Encounter (HOSPITAL_BASED_OUTPATIENT_CLINIC_OR_DEPARTMENT_OTHER): Payer: Self-pay | Admitting: Emergency Medicine

## 2021-06-25 ENCOUNTER — Other Ambulatory Visit: Payer: Self-pay

## 2021-06-25 DIAGNOSIS — S80861A Insect bite (nonvenomous), right lower leg, initial encounter: Secondary | ICD-10-CM | POA: Diagnosis not present

## 2021-06-25 DIAGNOSIS — S40861A Insect bite (nonvenomous) of right upper arm, initial encounter: Secondary | ICD-10-CM | POA: Insufficient documentation

## 2021-06-25 DIAGNOSIS — S80862A Insect bite (nonvenomous), left lower leg, initial encounter: Secondary | ICD-10-CM | POA: Insufficient documentation

## 2021-06-25 DIAGNOSIS — S40862A Insect bite (nonvenomous) of left upper arm, initial encounter: Secondary | ICD-10-CM | POA: Diagnosis not present

## 2021-06-25 DIAGNOSIS — W57XXXA Bitten or stung by nonvenomous insect and other nonvenomous arthropods, initial encounter: Secondary | ICD-10-CM | POA: Diagnosis not present

## 2021-06-25 DIAGNOSIS — Y9259 Other trade areas as the place of occurrence of the external cause: Secondary | ICD-10-CM | POA: Insufficient documentation

## 2021-06-25 MED ORDER — HYDROCORTISONE 2.5 % EX OINT: TOPICAL_OINTMENT | Freq: Two times a day (BID) | CUTANEOUS | 0 refills | Status: AC

## 2021-06-25 NOTE — Discharge Instructions (Signed)
You were seen in the emergency department for itchy lesions on your arms and legs.  This is likely bedbug bites.  You should wash your clothes in hot water.  We are prescribing you some steroid cream to apply to the affected areas.  Return if any worsening or concerning symptoms. ?

## 2021-06-25 NOTE — ED Notes (Signed)
Patient states that he got bit by  bed bugs on Tuesday night.Patient has some sore on his arm.patient states that it hurts and itch. ?

## 2021-06-25 NOTE — ED Provider Notes (Signed)
?[facility] EMERGENCY DEPARTMENT ?Provider Note ? ? ?CSN: [id number] ?Arrival date & time: 06/25/21  1759 ? ?  ? ?History ? ?Chief Complaint  ?Patient presents with  ? Insect Bite  ? ? ?Juan Moses is a 51 y.o. male.  He is complaining of itchy bites over his arms and legs. Has been staying at a hotel.  Has tried nothing for it.  No fevers or chills.  No other complaints. ? ?The history is provided by the patient.  ?Rash ?Location:  Shoulder/arm and leg ?Shoulder/arm rash location:  L arm and R arm ?Leg rash location:  L leg and R leg ?Quality: itchiness   ?Onset quality:  Gradual ?Timing:  Constant ?Progression:  Unchanged ?Chronicity:  New ?Relieved by:  None tried ?Worsened by:  Nothing ?Ineffective treatments:  None tried ?Associated symptoms: no abdominal pain, no fever, no nausea and not vomiting   ? ?  ? ?Home Medications ?Prior to Admission medications   ?Medication Sig Start Date End Date Taking? Authorizing Provider  ?diphenhydramine-acetaminophen (TYLENOL PM) 25-500 MG TABS tablet Take 2 tablets by mouth at bedtime as needed (sleep).    [provider]  ?ferrous sulfate 325 (65 FE) MG tablet Take 1 tablet (325 mg total) by mouth 2 (two) times daily with a meal. 10/09/16   Zannie Cove, MD  ?Magnesium Sulfate, Laxative, (EPSOM SALT PO) Take 30 mLs by mouth daily as needed (laxative).    [provider]  ?pantoprazole (PROTONIX) 40 MG tablet Take 1 tablet (40 mg total) by mouth 2 (two) times daily before a meal. 10/01/16   Zannie Cove, MD  ?   ? ?Allergies    ?Morphine and related   ? ?Review of Systems   ?Review of Systems  ?Constitutional:  Negative for fever.  ?Gastrointestinal:  Negative for abdominal pain, nausea and vomiting.  ?Skin:  Positive for rash.  ? ?Physical Exam ?Updated Vital Signs ?BP 114/87 (BP Location: Left Arm)   Pulse (!) 112   Temp 98.3 ?F (36.8 ?C) (Oral)   Resp 16   Ht 6\' 3"  (1.905 m)   Wt 63.5 kg   SpO2 97%   BMI 17.50 kg/m?  ?Physical  Exam ?Vitals and nursing note reviewed.  ?Constitutional:   ?   Appearance: Normal appearance. He is well-developed.  ?HENT:  ?   Head: Normocephalic and atraumatic.  ?Eyes:  ?   Conjunctiva/sclera: Conjunctivae normal.  ?Pulmonary:  ?   Effort: Pulmonary effort is normal.  ?Musculoskeletal:     ?   General: Normal range of motion.  ?   Cervical back: Neck supple.  ?Skin: ?   General: Skin is warm and dry.  ?   Comments: He has a few lesions on his arms and legs.  No burrows.  No hives.  ?Neurological:  ?   General: No focal deficit present.  ?   Mental Status: He is alert.  ?   GCS: GCS eye subscore is 4. GCS verbal subscore is 5. GCS motor subscore is 6.  ? ? ?ED Results / Procedures / Treatments   ?Labs ?(all labs ordered are listed, but only abnormal results are displayed) ?Labs Reviewed - No data to display ? ?EKG ?None ? ?Radiology ?No results found. ? ?Procedures ?Procedures  ? ? ?Medications Ordered in ED ?Medications - No data to display ? ?ED Course/ Medical Decision Making/ A&P ?  ?                        ?  Medical Decision Making ?Risk ?Prescription drug management. ? ?Differential includes contact dermatitis, bedbugs, scabies.  Patient not systemically toxic.  Antibiotic treatment discussed.  Return instructions discussed. ? ? ? ? ? ? ? ? ?Final Clinical Impression(s) / ED Diagnoses ?Final diagnoses:  ?Bedbug bite, initial encounter  ? ? ?Rx / DC Orders ?ED Discharge Orders   ? ? None  ? ?  ? ? ?  ?Terrilee Files, MD ?06/26/21 1006 ? ?

## 2021-06-25 NOTE — ED Triage Notes (Signed)
Pt arrives pov, steady gait with c/o concern for bed bug bites bilaterally to arms and legs after staying in hotel x 5 days pta. Endorses itching  ?

## 2023-02-13 ENCOUNTER — Other Ambulatory Visit: Payer: Self-pay

## 2023-02-13 ENCOUNTER — Emergency Department (HOSPITAL_BASED_OUTPATIENT_CLINIC_OR_DEPARTMENT_OTHER)
Admission: EM | Admit: 2023-02-13 | Discharge: 2023-02-13 | Discharge status: Against Medical Advice | Payer: 59 | Attending: Emergency Medicine | Admitting: Emergency Medicine

## 2023-02-13 ENCOUNTER — Encounter (HOSPITAL_BASED_OUTPATIENT_CLINIC_OR_DEPARTMENT_OTHER): Payer: Self-pay | Admitting: Emergency Medicine

## 2023-02-13 DIAGNOSIS — U071 COVID-19: Secondary | ICD-10-CM | POA: Diagnosis not present

## 2023-02-13 DIAGNOSIS — Z5321 Procedure and treatment not carried out due to patient leaving prior to being seen by health care provider: Secondary | ICD-10-CM | POA: Insufficient documentation

## 2023-02-13 DIAGNOSIS — R059 Cough, unspecified: Secondary | ICD-10-CM | POA: Diagnosis present

## 2023-02-13 LAB — RESP PANEL BY RT-PCR (RSV, FLU A&B, COVID)  RVPGX2
Influenza A by PCR: NEGATIVE
Influenza B by PCR: NEGATIVE
Resp Syncytial Virus by PCR: NEGATIVE
SARS Coronavirus 2 by RT PCR: POSITIVE — AB

## 2023-02-13 NOTE — ED Triage Notes (Addendum)
Pt c/o cough x couple days and had a possible Covid exposure
